# Patient Record
Sex: Male | Born: 1955
Health system: Southern US, Community
[De-identification: ages and names within clinical notes are randomized; demographics above are authoritative.]

## PROBLEM LIST (undated history)

## (undated) DIAGNOSIS — K219 Gastro-esophageal reflux disease without esophagitis: Secondary | ICD-10-CM

## (undated) DIAGNOSIS — T8859XA Other complications of anesthesia, initial encounter: Secondary | ICD-10-CM

## (undated) DIAGNOSIS — E7800 Pure hypercholesterolemia, unspecified: Secondary | ICD-10-CM

## (undated) DIAGNOSIS — Z9889 Other specified postprocedural states: Secondary | ICD-10-CM

## (undated) DIAGNOSIS — A770 Spotted fever due to Rickettsia rickettsii: Secondary | ICD-10-CM

## (undated) DIAGNOSIS — M1990 Unspecified osteoarthritis, unspecified site: Secondary | ICD-10-CM

## (undated) DIAGNOSIS — R112 Nausea with vomiting, unspecified: Secondary | ICD-10-CM

## (undated) DIAGNOSIS — T4145XA Adverse effect of unspecified anesthetic, initial encounter: Secondary | ICD-10-CM

## (undated) HISTORY — DX: Pure hypercholesterolemia, unspecified: E78.00

## (undated) HISTORY — DX: Gastro-esophageal reflux disease without esophagitis: K21.9

## (undated) HISTORY — DX: Spotted fever due to Rickettsia rickettsii: A77.0

## (undated) HISTORY — PX: VASECTOMY: SHX75

## (undated) HISTORY — DX: Unspecified osteoarthritis, unspecified site: M19.90

---

## 1990-10-01 HISTORY — PX: HERNIA REPAIR: SHX51

## 1993-10-01 HISTORY — PX: WRIST SURGERY: SHX841

## 1996-10-01 HISTORY — PX: VASECTOMY REVERSAL: SHX243

## 1998-06-03 ENCOUNTER — Ambulatory Visit (HOSPITAL_COMMUNITY): Admission: RE | Admit: 1998-06-03 | Discharge: 1998-06-03 | Payer: Self-pay | Admitting: *Deleted

## 2000-04-24 ENCOUNTER — Inpatient Hospital Stay (HOSPITAL_COMMUNITY): Admission: AD | Admit: 2000-04-24 | Discharge: 2000-04-26 | Payer: Self-pay | Admitting: Neurosurgery

## 2000-04-24 ENCOUNTER — Encounter: Payer: Self-pay | Admitting: Neurosurgery

## 2000-04-24 ENCOUNTER — Encounter: Admission: RE | Admit: 2000-04-24 | Discharge: 2000-04-24 | Payer: Self-pay | Admitting: Neurosurgery

## 2000-04-25 ENCOUNTER — Encounter: Payer: Self-pay | Admitting: Neurosurgery

## 2000-05-09 ENCOUNTER — Encounter: Payer: Self-pay | Admitting: Neurosurgery

## 2000-05-09 ENCOUNTER — Ambulatory Visit (HOSPITAL_COMMUNITY): Admission: RE | Admit: 2000-05-09 | Discharge: 2000-05-09 | Payer: Self-pay | Admitting: Neurosurgery

## 2000-05-23 ENCOUNTER — Encounter: Payer: Self-pay | Admitting: Neurosurgery

## 2000-05-23 ENCOUNTER — Ambulatory Visit (HOSPITAL_COMMUNITY): Admission: RE | Admit: 2000-05-23 | Discharge: 2000-05-23 | Payer: Self-pay | Admitting: Neurosurgery

## 2004-02-23 ENCOUNTER — Encounter: Admission: RE | Admit: 2004-02-23 | Discharge: 2004-02-23 | Payer: Self-pay | Admitting: Neurosurgery

## 2008-07-27 ENCOUNTER — Ambulatory Visit (HOSPITAL_BASED_OUTPATIENT_CLINIC_OR_DEPARTMENT_OTHER): Admission: RE | Admit: 2008-07-27 | Discharge: 2008-07-27 | Payer: Self-pay | Admitting: Orthopaedic Surgery

## 2008-07-27 HISTORY — PX: OTHER SURGICAL HISTORY: SHX169

## 2008-08-23 ENCOUNTER — Ambulatory Visit: Payer: Self-pay | Admitting: Internal Medicine

## 2008-08-24 ENCOUNTER — Ambulatory Visit: Payer: Self-pay | Admitting: Internal Medicine

## 2008-12-07 ENCOUNTER — Ambulatory Visit: Payer: Self-pay | Admitting: Internal Medicine

## 2008-12-07 ENCOUNTER — Encounter: Admission: RE | Admit: 2008-12-07 | Discharge: 2008-12-07 | Payer: Self-pay | Admitting: Internal Medicine

## 2009-08-23 ENCOUNTER — Encounter: Admission: RE | Admit: 2009-08-23 | Discharge: 2009-08-23 | Payer: Self-pay | Admitting: Neurosurgery

## 2010-03-16 ENCOUNTER — Encounter: Admission: RE | Admit: 2010-03-16 | Discharge: 2010-03-16 | Payer: Self-pay | Admitting: Neurosurgery

## 2010-12-21 ENCOUNTER — Ambulatory Visit (HOSPITAL_COMMUNITY)
Admission: RE | Admit: 2010-12-21 | Discharge: 2010-12-21 | Disposition: A | Discharge status: Home / Self Care | Payer: BC Managed Care – PPO | Source: Ambulatory Visit | Attending: Orthopedic Surgery | Admitting: Orthopedic Surgery

## 2010-12-21 ENCOUNTER — Other Ambulatory Visit (HOSPITAL_COMMUNITY): Payer: Self-pay | Admitting: Orthopedic Surgery

## 2010-12-21 ENCOUNTER — Encounter (HOSPITAL_COMMUNITY)
Admission: RE | Admit: 2010-12-21 | Discharge: 2010-12-21 | Disposition: A | Discharge status: Home / Self Care | Payer: BC Managed Care – PPO | Source: Ambulatory Visit | Attending: Orthopedic Surgery | Admitting: Orthopedic Surgery

## 2010-12-21 DIAGNOSIS — M19011 Primary osteoarthritis, right shoulder: Secondary | ICD-10-CM

## 2010-12-21 DIAGNOSIS — Z01812 Encounter for preprocedural laboratory examination: Secondary | ICD-10-CM | POA: Insufficient documentation

## 2010-12-21 DIAGNOSIS — Z01818 Encounter for other preprocedural examination: Secondary | ICD-10-CM | POA: Insufficient documentation

## 2010-12-21 LAB — ABO/RH: ABO/RH(D): A POS

## 2010-12-21 LAB — SURGICAL PCR SCREEN
MRSA, PCR: NEGATIVE
Staphylococcus aureus: NEGATIVE

## 2010-12-21 LAB — CBC
HCT: 43.8 % (ref 39.0–52.0)
Hemoglobin: 15.7 g/dL (ref 13.0–17.0)
MCH: 31.4 pg (ref 26.0–34.0)
MCHC: 35.8 g/dL (ref 30.0–36.0)
MCV: 87.6 fL (ref 78.0–100.0)
Platelets: 235 10*3/uL (ref 150–400)
RBC: 5 MIL/uL (ref 4.22–5.81)
RDW: 12.3 % (ref 11.5–15.5)
WBC: 6.9 10*3/uL (ref 4.0–10.5)

## 2010-12-21 LAB — PROTIME-INR
INR: 0.96 (ref 0.00–1.49)
Prothrombin Time: 13 seconds (ref 11.6–15.2)

## 2010-12-21 LAB — TYPE AND SCREEN
ABO/RH(D): A POS
Antibody Screen: NEGATIVE

## 2010-12-26 ENCOUNTER — Inpatient Hospital Stay (HOSPITAL_COMMUNITY)
Admission: RE | Admit: 2010-12-26 | Discharge: 2010-12-28 | DRG: 491 | Disposition: A | Discharge status: Home / Self Care | Payer: BC Managed Care – PPO | Source: Ambulatory Visit | Attending: Orthopedic Surgery | Admitting: Orthopedic Surgery

## 2010-12-26 ENCOUNTER — Inpatient Hospital Stay (HOSPITAL_COMMUNITY): Payer: BC Managed Care – PPO

## 2010-12-26 DIAGNOSIS — M19019 Primary osteoarthritis, unspecified shoulder: Principal | ICD-10-CM | POA: Diagnosis present

## 2010-12-26 DIAGNOSIS — Z7982 Long term (current) use of aspirin: Secondary | ICD-10-CM

## 2010-12-26 DIAGNOSIS — Z79899 Other long term (current) drug therapy: Secondary | ICD-10-CM

## 2010-12-26 HISTORY — PX: TOTAL SHOULDER ARTHROPLASTY: SHX126

## 2010-12-27 LAB — BASIC METABOLIC PANEL
BUN: 15 mg/dL (ref 6–23)
CO2: 25 mEq/L (ref 19–32)
Calcium: 8.4 mg/dL (ref 8.4–10.5)
Chloride: 108 mEq/L (ref 96–112)
Creatinine, Ser: 0.94 mg/dL (ref 0.4–1.5)
GFR calc Af Amer: >60 mL/min (ref >60)
GFR calc non Af Amer: >60 mL/min (ref >60)
Glucose, Bld: 128 mg/dL — ABNORMAL HIGH (ref 70–99)
Potassium: 4.2 mEq/L (ref 3.5–5.1)
Sodium: 139 mEq/L (ref 135–145)

## 2010-12-27 LAB — CBC
HCT: 36.8 % — ABNORMAL LOW (ref 39.0–52.0)
Hemoglobin: 12.8 g/dL — ABNORMAL LOW (ref 13.0–17.0)
MCH: 31.3 pg (ref 26.0–34.0)
MCHC: 34.8 g/dL (ref 30.0–36.0)
MCV: 90 fL (ref 78.0–100.0)
Platelets: 178 10*3/uL (ref 150–400)
RBC: 4.09 MIL/uL — ABNORMAL LOW (ref 4.22–5.81)
RDW: 12.6 % (ref 11.5–15.5)
WBC: 14.8 10*3/uL — ABNORMAL HIGH (ref 4.0–10.5)

## 2010-12-28 LAB — BASIC METABOLIC PANEL
BUN: 11 mg/dL (ref 6–23)
CO2: 28 mEq/L (ref 19–32)
Calcium: 8.5 mg/dL (ref 8.4–10.5)
Chloride: 103 mEq/L (ref 96–112)
Creatinine, Ser: 1.03 mg/dL (ref 0.4–1.5)
GFR calc Af Amer: >60 mL/min (ref >60)
GFR calc non Af Amer: >60 mL/min (ref >60)
Glucose, Bld: 113 mg/dL — ABNORMAL HIGH (ref 70–99)
Potassium: 4 mEq/L (ref 3.5–5.1)
Sodium: 138 mEq/L (ref 135–145)

## 2010-12-28 LAB — CBC
HCT: 32.9 % — ABNORMAL LOW (ref 39.0–52.0)
Hemoglobin: 11.4 g/dL — ABNORMAL LOW (ref 13.0–17.0)
MCH: 31.5 pg (ref 26.0–34.0)
MCHC: 34.7 g/dL (ref 30.0–36.0)
MCV: 90.9 fL (ref 78.0–100.0)
Platelets: 154 10*3/uL (ref 150–400)
RBC: 3.62 MIL/uL — ABNORMAL LOW (ref 4.22–5.81)
RDW: 12.8 % (ref 11.5–15.5)
WBC: 10.8 10*3/uL — ABNORMAL HIGH (ref 4.0–10.5)

## 2010-12-28 NOTE — Op Note (Signed)
[name], [name]            ACCOUNT NO.:  [id number]  MEDICAL RECORD NO.:  [id number]           PATIENT TYPE:  I  LOCATION:  5031                         FACILITY:  [facility]  PHYSICIAN:  Jones Broom, MD    DATE OF BIRTH:  September 12, 1956  DATE OF PROCEDURE:  12/26/2010 DATE OF DISCHARGE:                              OPERATIVE REPORT   PREOPERATIVE DIAGNOSIS:  Right shoulder osteoarthritis.  POSTOPERATIVE DIAGNOSIS:  Right shoulder osteoarthritis.  PROCEDURE PERFORMED:  Right shoulder total shoulder replacement.  ATTENDING SURGEON:  Jones Broom, MD  ASSISTANT:  Eulas Post, MD and McKenzie, PA-S  ANESTHESIA:  GETA with preoperative interscalene block.  COMPLICATIONS:  None.  DRAINS:  One medium Hemovac.  SPECIMENS:  Humeral head was cut and discarded.  ESTIMATED BLOOD LOSS:  250 mL.  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  Over the last 3 years, he has dealt with significant shoulder pain, which was initially evaluated by Dr. Jerl Murphy.  He had arthroscopy and was noted to have glenohumeral degenerative changes.  He had debridement, which was not significantly helpful.  He had intra-articular corticosteroid injection, which relieved his symptoms completely, but only for about 10 or 12 days and the pain subsequently returned.  He was on Vicodin as well as Mobic on a regular basis, but felt he could still not adequately control his symptoms and was not able to do the things that he enjoyed doing.  Given his limitation quality of life, he wanted to go forward with shoulder arthroplasty surgery.  I felt that given the extensive involvement of the glenoid, he would not be pleased with a hemiarthroplasty and therefore I recommend total shoulder arthroplasty despite his relatively young age and high activity level.  He understood risks, benefits, and alternatives to  surgery including, but not limited to risk of bleeding, infection, damage to neurovascular structures, risk of instability, stiffness, and potential need for future surgery.  He understood all of this and elected to go forward with surgery.  OPERATIVE FINDINGS:  Examination under anesthesia demonstrated external rotation to about 35 degrees on the right.  Forward flexion is to 170 degrees.  No instability was noted.  A DePuy press-fit AP size 12 stem with a 52 x 18 head at the 135 angle and a 48 cemented glenoid was placed.  Stem was not cemented.  PROCEDURE:  The patient was identified in the preoperative holding area where I personally marked the operative site after verifying site, side, and procedure with the patient.  He had an interscalene block given by the attending anesthesiologist and then was taken back to the operating room where general anesthesia was induced without complication.  He was then placed in the beach-chair position with the head and neck carefully padded in position and the opposite extremity as well as feet and legs carefully padded in position.  The right upper extremity was then prepped and draped in a standard sterile fashion.  The appropriate time- out procedure was carried out by myself, the operative staff, and anesthesia staff, all verifying  site, side, and procedure.  The patient did receive IV clindamycin before the incision was made.  The right upper extremity was prepped and draped in a standard sterile fashion and a sterile arm holder was used to assist in positioning during the procedure.  An approximately 12-cm incision was made from the tip of coracoid down to the center point of the humerus at the level of the axilla.  Dissection was carried down sharply through subcutaneous tissues and cephalic vein was identified and taken laterally with the deltoid.  The pectoralis major was taken medially.  The upper 1 cm of the pectoralis major was  released from its attachment on the humerus. Clavipectoral fascia was incised just lateral to conjoined tendon.  This incision was carried up to, but not into the coracoacromial ligament. Digital palpation was used to prove the integrity of the axillary nerve, which was protected throughout the procedure.  Musculocutaneous nerve was not palpated in the operative field.  The conjoined tendon was then gently retracted medially and the deltoid laterally.  The anterior circumflex humeral vessels were clamped and coagulated.  Soft tissue overlying the biceps was incised and this incision was carried across the transverse humeral ligament to the base of the coracoid.  The biceps was then tenodesed to the soft tissue just above the pectoralis major and the remaining portion of the biceps superiorly was excised.  An osteotomy was then performed to the lesser tuberosity and the subscapularis was freed from the underlying capsule.  Capsule was then released all the way down to the 6 o'clock position of the humeral head and neck.  The humeral head was then delivered with simultaneous adduction, extension, and external rotation.  All humeral osteophytes were removed.  There were small-to-moderate sized inferior humeral osteophytes and the anatomic neck of the humerus was marked and cut freehand at approximately 30 degrees retroversion within about 3 mm of the cuff reflection posteriorly.  The head size was estimated to be a 52 medium offset head.  At that point, the humeral head was retracted posteriorly with a Fakuda retractor and the anterior-inferior capsule was excised.  Great care was taken to protect the axillary nerve and was again palpated to ensure its integrity.  The remaining biceps anchor and the entire anterior-inferior labrum was excised.  The posterior labrum was excised, but the posterior capsule was not released.  The +3 48-mm guide was felt to be appropriate size for the glenoid to  allow some preferential anterior reaming given his slight retroversion noted on x- ray and posterior subluxation.  The reamer was used to ream to concentric bone with punctate bleeding.  This gave an excellent concentric surface.  The center hole was then drilled for an anchor peg glenoid followed by the 3 peripheral holes and none of the holes exited the glenoid wall.  This was then pulse irrigated and the holes were dried with Surgicel and thrombin.  The three peripheral holes were then pressurized, cemented, and the anchor peg glenoid was placed and impacted with an excellent fit.  This was held manually and placed in place until the cement hardened.  The proximal humerus was then again exposed taking care not to displace the glenoid.  The glenoid was a 48- mm component.  The humerus was then sequentially reamed going from 6-12 mm until the appropriate cortical contact was reached.  A 12 box osteotome was then used and a 12-mm broach.  The broach handle was removed and trial head was placed.  The eccentric 52 x 18 head fit best. With the trial implantation of the component, there was excellent soft tissue tension with approximately 50% posterior translation with immediate snap back to the anatomic position.  With forward elevation, there was no tendency towards posterior subluxation.  The trial was removed and the final implant was prepared on the back table with a 12 stem and a 52 x 18 eccentric head.  The implant was then impacted and achieved excellent anatomic reconstruction of the proximal humerus.  A #2 fiber wire was placed around the neck of the implant prior to impaction for later subscapularis repair.  The joint was then copiously irrigated with pulse lavage.  The subscapularis and lesser tuberosity osteotomy were repaired using three #2 fiber wires and the #2 FiberWire around the neck of the implant in a double row type repair.  One #1 Ethibond was placed in the rotator  interval just above the lesser tuberosity in a figure-of-eight fashion.  After repair of the lesser tuberosity, a medium Hemovac was placed at the anterolateral and again copious irrigation was used.  Again finger palpation was used to verify the integrity of the axillary nerve.  At this point, the wound was closed in layers with 2-0 Vicryl in deep dermal layer, 4-0 Monocryl for skin closure, and sterile dressings were then applied including Steri- Strips, 4x4s, ABDs, and tape.  The patient was placed in a sling, allowed to awaken from general anesthesia, transferred to the stretcher, and taken to the recovery room in a stable condition.  POSTOPERATIVE PLAN:  He will be kept overnight in the hospital for 2 nights for pain control and therapy.  Drain will be pulled tomorrow.  He will have Percocet and PCA for pain control.  He will begin early passive range of motion protocol with goal of 40 degrees external rotation, 140 degrees forward flexion.     Jones Broom, MD     JC/MEDQ  D:  12/26/2010  T:  12/27/2010  Job:  [id number]  Electronically Signed by Jones Broom  on 12/28/2010 01:19:53 PM

## 2011-02-13 NOTE — Op Note (Signed)
[name], [name]            ACCOUNT NO.:  [id number]   MEDICAL RECORD NO.:  [id number]          PATIENT TYPE:  AMB   LOCATION:  DSC                          FACILITY:  [facility]   PHYSICIAN:  Lubertha Basque. Dalldorf, M.D.DATE OF BIRTH:  12/27/1955   DATE OF PROCEDURE:  07/27/2008  DATE OF DISCHARGE:                               OPERATIVE REPORT   PREOPERATIVE DIAGNOSES:  1. Right shoulder impingement.  2. Right shoulder acromioclavicular degeneration.  3. Right shoulder partial rotator cuff tear.   POSTOPERATIVE DIAGNOSES:  1. Right shoulder impingement.  2. Right shoulder acromioclavicular degeneration.  3. Right shoulder partial rotator cuff tear.   PROCEDURES:  1. Right shoulder arthroscopic acromioplasty.  2. Right shoulder arthroscopic acromioclavicular resection.  3. Right shoulder arthroscopic subscapularis debridement.   ANESTHESIA:  General and block.   ATTENDING SURGEON:  Lubertha Basque. Jerl Santos, MD   ASSISTANT:  Lindwood Qua, PA   INDICATIONS FOR PROCEDURE:  The patient is a 55 year old man with many  years of right shoulder pain.  This has persisted despite oral anti-  inflammatories and therapy program.  He also received a subacromial  injection which did help significantly for several weeks.  By x-ray, he  has a prominent subacromial spur and spur at the [facility] joint.  His pain  which limits his ability to rest and use his arm and he is offered an  arthroscopy.  Informed operative consent was obtained after discussion  of possible complications including reaction to anesthesia and  infection.   SUMMARY FINDINGS AND PROCEDURE:  Under general anesthesia and a block, a  right shoulder arthroscopy was performed.  Glenohumeral joint showed  some mild degenerative change across the glenoid.  He had a partial  subscapularis tear addressed with the debridement.  The biceps and rest  of the rotator cuff appeared benign.  In the subacromial space, he had  some bursitis  addressed with a thorough bursectomy, but no significant  cuff tear was seen from above.  He did have a very prominent subacromial  morphology addressed with an acromioplasty back to a flat surface.  He  also had a spur and bone-on-bone contact at the [facility] joint and a formal AC  resection was done.   DESCRIPTION OF PROCEDURE:  The patient was brought to the operating  suite where general anesthetic was applied without difficulty.  He was  also given a block in the preanesthesia area.  He was positioned in  beach-chair position and prepped and draped in normal sterile fashion.  After administration of IV Kefzol, an arthroscopy of the right shoulder  was performed through a total of 3 portals.  Findings were as noted  above.  Procedure consisted of the subscapularis debridement followed by  work in the subacromial space.  I performed an acromioplasty with the  bur in the lateral position following transfer of the bur to the  posterior position.  I also performed the [facility] resection initially with a  bur in the lateral position following transfer of the bur to the  anterior position.  The cuff again was thoroughly examined.  The  shoulder was thoroughly  irrigated.  Simple sutures of nylon was used to  loosely reapproximate the portals followed by Adaptic and a dry gauze  dressing with tape.  Estimated blood loss and intraoperative fluids  obtained from anesthesia records.   DISPOSITION:  The patient was extubated in the operating room and taken  to recovery room in stable addition.  He was to go home on same day and  follow up with me in the office closely.  I will contact him by phone  tonight.      Lubertha Basque Jerl Santos, M.D.  Electronically Signed     PGD/MEDQ  D:  07/27/2008  T:  07/27/2008  Job:  [id number]

## 2011-02-16 NOTE — Discharge Summary (Signed)
[place]  Patient:    [name], [name]                     MRN: [id number] Adm. Date:  04/24/00 Disc. Date: 04/26/00 Attending:  Payton Doughty, M.D.                           Discharge Summary  ADMITTING DIAGNOSIS:  Intractable back pain probable herniated disk.  DISCHARGE DIAGNOSIS:  Herniated disk.  PROCEDURE:  Lumbar epidural steroid injection.  HISTORY OF PRESENT ILLNESS:  55 year-old right-handed white gentleman whose history and physical is recounted in the chart, basically developed intractable back pain over a 12 hour period the day prior to admission, was unable to walk.  Visited my office, and I decided to admit him and obtain an MRI.  His medical history is fairly unremarkable.  MEDICATIONS:  None.  ALLERGIES:  PENICILLIN.  PHYSICAL EXAMINATION:  GENERAL:  Unremarkable with extremely limited range of motion in his low back, inability to stand up straight.  NEUROLOGIC:  Neurologically he was intact with positive straight leg bilaterally.  He was admitted and placed on a PCA was still unable to get up to void.  He underwent an MRI which showed annular disruption of L4-L5 and L5, S1.  There was no large herniated disk.  There was some acute blood behind L4-L5.  Based on this study, he underwent a Lumbar epidural steroid which resulted in a marked improvement in his strength and his ability to move about.  He can sit up out of the bed and walk to the bathroom now.  He is using oral medications.  He is now being discharged home since he can walk.  FOLLOW-UP:  His follow-up will be in the [facility] office next week to arrange second lumbar epidural steroid injection.  His wife knows to call me with difficulties. DD:  04/26/00 TD:  04/29/00 Job: [id number]

## 2011-02-16 NOTE — H&P (Signed)
[place]  Patient:    [name], [name]                     MRN: [id number] Adm. Date:  04/24/00 Attending:  Payton Doughty, M.D.                         History and Physical  ADMITTING DIAGNOSIS:  Intractable back pain.  This is a 55 year old right-handed white gentleman, whose wife is a Garment/textile technologist at [employer].  Last night, he was playing racquetball and had the onset of pain in his right back, just above the posterior superior iliac spine. He used some Hydrocodone last night with some relief.  This morning, he was reaching overhead and had back pain that put him to his knees, and he came to me seeking further help.  It does not go down his legs.  He has not lost bowel/bladder control, but he cannot stand up straight and cannot walk.  MEDICAL HISTORY:  Remarkable for hypercholesterolemia.  He had a right wrist operation in 1995, a hernia in 1992, a vasectomy in 1991 with reversal in 1998, and a new vasectomy in 2000.  ALLERGIES:  He is allergic to PENICILLIN.  MEDICATIONS:  His only medication is Lipitor 10 mg q.d.  FAMILY HISTORY:  Mother is 60, with coronary artery disease.  Father deceased, f unknown illnesses.  REVIEW OF SYSTEMS:  Remarkable for high cholesterol and back pain.  PHYSICAL EXAMINATION:  HEENT:  Exam is within normal limits.  NECK:  He has reasonable range of motion in his neck.  CHEST:  Clear.  CARDIAC:  Regular rate and rhythm.  ABDOMEN:  Nontender with no hepatosplenomegaly.  EXTREMITIES:  Without clubbing or cyanosis.  GU:  Exam is deferred.  Peripheral pulses are good.  NEUROLOGIC:  He is awake, alert and oriented; cranial nerves are intact.  Motor  exam demonstrates 5/5 strength throughout the upper and lower extremities, save for pain limit of hip flexion or weakness on the right side.  There is no current sensory deficit.  Reflexes are 1 at the knees, 1 at the ankles; toes  downgoing bilaterally.  Straight leg raise is negative.  When strength testing the upper extremities, pulling was very painful and pushing was not.  Plain spine films are unremarkable.  CLINICAL IMPRESSION:  Intractable back pain; may represent simple strain but I m quite concerned about him having a mid or upper level disc rupture.  PLAN:  I am planning on admitting him for pain control and obtaining an MRI. DD:  04/24/00 TD:  04/24/00 Job: [id number]

## 2011-09-07 ENCOUNTER — Encounter: Payer: Self-pay | Admitting: Internal Medicine

## 2011-10-05 ENCOUNTER — Encounter: Payer: Self-pay | Admitting: Internal Medicine

## 2011-10-05 ENCOUNTER — Ambulatory Visit (INDEPENDENT_AMBULATORY_CARE_PROVIDER_SITE_OTHER): Payer: BC Managed Care – PPO | Admitting: Internal Medicine

## 2011-10-05 VITALS — BP 118/88 | HR 72 | Ht 68.0 in | Wt 232.8 lb

## 2011-10-05 DIAGNOSIS — E785 Hyperlipidemia, unspecified: Secondary | ICD-10-CM | POA: Insufficient documentation

## 2011-10-05 DIAGNOSIS — R195 Other fecal abnormalities: Secondary | ICD-10-CM

## 2011-10-05 MED ORDER — PEG-KCL-NACL-NASULF-NA ASC-C 100 G PO SOLR: 1.0000 | Freq: Once | ORAL | Status: DC

## 2011-10-05 NOTE — Patient Instructions (Signed)
You have been scheduled for a Colonoscopy with separate instructions given. Your prep kit has been sent to your pharmacy for you to pick up. 

## 2011-10-05 NOTE — Progress Notes (Signed)
  Subjective:    Patient ID: [name], male    DOB: 10/24/1955, 56 y.o.   MRN: [id number]  HPI  This 56 year old white man had 2 of 3 stool cards positive for Hemoccult testing recently. He has no other active GI symptoms. I reviewed the office notes from his primary care physician Dr. Roseanne Reno. Allergies  Allergen Reactions  . Penicillins    Outpatient Prescriptions Prior to Visit  Medication Sig Dispense Refill  . atorvastatin (LIPITOR) 10 MG tablet Take 10 mg by mouth daily.        . clotrimazole-betamethasone (LOTRISONE) cream Apply 1 application topically 2 (two) times daily.        . famotidine (PEPCID) 20 MG tablet Take 20 mg by mouth daily.        Marland Kitchen HYDROcodone-acetaminophen (LORTAB) 10-500 MG per tablet 1/2-1 tablet by mouth three times a day as needed        Past Medical History  Diagnosis Date  . [facility] spotted fever)   . Hypercholesterolemia    Past Surgical History  Procedure Date  . Wrist surgery 1995    right  . Hernia repair 1992  . Vasectomy 1991, 2000  . Vasectomy reversal 1998  . Right shoulder arthroscopic acrominoplasty 07/27/2008    w/acromioclavicular resection, subcapularis debridement  . Total shoulder arthroplasty 12/26/2010    right   History   Social History  . Marital Status: Married         Number of Children: 3  .     Occupational History  . retired    Social History Main Topics  . Smoking status: Never Smoker   . Smokeless tobacco: Former Neurosurgeon   Comment: quit 15 years ago  . Alcohol Use: No  . Drug Use: No  .            Social History Narrative   Retired Chartered loss adjuster.   Family History  Problem Relation Age of Onset  . Coronary artery disease Mother   . Lung cancer Mother   . Crohn's disease Brother         Review of Systems Positive for some back pain and itching, all other systems negative    Objective:   Physical Exam General:  NAD Eyes:   anicteric Lungs:  clear Heart:  S1S2 no rubs,  murmurs or gallops Abdomen:  soft and nontender, BS+ Ext:   no edema             Assessment & Plan:   1. Heme + stool    He has an asymptomatic Hemoccult-positive stool. Colonoscopy is indicated given the positive screening Hemoccults. I have explained the risks benefits and indications and he understands and agrees to proceed. I appreciate the opportunity to care for this patient.

## 2011-10-08 ENCOUNTER — Encounter: Payer: Self-pay | Admitting: Internal Medicine

## 2011-11-15 ENCOUNTER — Ambulatory Visit (AMBULATORY_SURGERY_CENTER): Payer: BC Managed Care – PPO | Admitting: Internal Medicine

## 2011-11-15 ENCOUNTER — Encounter: Payer: Self-pay | Admitting: Internal Medicine

## 2011-11-15 VITALS — BP 142/77 | HR 57 | Temp 95.5°F | Resp 22 | Ht 68.0 in | Wt 232.0 lb

## 2011-11-15 DIAGNOSIS — K5730 Diverticulosis of large intestine without perforation or abscess without bleeding: Secondary | ICD-10-CM

## 2011-11-15 DIAGNOSIS — R195 Other fecal abnormalities: Secondary | ICD-10-CM

## 2011-11-15 DIAGNOSIS — K648 Other hemorrhoids: Secondary | ICD-10-CM

## 2011-11-15 HISTORY — PX: COLONOSCOPY: SHX174

## 2011-11-15 MED ORDER — SODIUM CHLORIDE 0.9 % IV SOLN: 500.0000 mL | INTRAVENOUS | Status: DC

## 2011-11-15 NOTE — Op Note (Signed)
[facility] [address] [place], [place]  [address]  COLONOSCOPY PROCEDURE REPORT  PATIENT:  [name], [name]  MR#:  [id number] BIRTHDATE:  Feb 20, 1956, 55 yrs. old  GENDER:  male ENDOSCOPIST:  Iva Boop, MD, [facility] REF. BY:  Quitman Livings, M.D. PROCEDURE DATE:  11/15/2011 PROCEDURE:  Colonoscopy 19147 ASA CLASS:  Class II INDICATIONS:  heme positive stool MEDICATIONS:   These medications were titrated to patient response per physician's verbal order 100 mcg IV, Versed 10 mg IV, These medications were titrated to patient response per physician's verbal order  DESCRIPTION OF PROCEDURE:   After the risks benefits and alternatives of the procedure were thoroughly explained, informed consent was obtained.  Digital rectal exam was performed and revealed no abnormalities and normal prostate.   The LB 180AL [id number] endoscope was introduced through the anus and advanced to the cecum, which was identified by both the appendix and ileocecal valve, without limitations.  The quality of the prep was excellent, using MoviPrep.  The instrument was then slowly withdrawn as the colon was fully examined. <<PROCEDUREIMAGES>>  FINDINGS:  Mild diverticulosis was found in the sigmoid colon. This was otherwise a normal examination of the colon.   Retroflexed views in the rectum revealed internal hemorrhoids.    The time to cecum = 1:57 minutes. The scope was then withdrawn in 12:11 minutes from the cecum and the procedure completed. COMPLICATIONS:  None ENDOSCOPIC IMPRESSION: 1) Mild diverticulosis in the sigmoid colon 2) Internal hemorrhoids - likely cause heme + stool 3) Otherwise normal examination - excellent prep  REPEAT EXAM:  In 10 year(s) for routine screening colonoscopy.  Iva Boop, MD, [facility]  CC:  The Patient and Quitman Livings, MD  n. Rosalie Doctor:   Iva Boop at 11/15/2011 11:03 AM  Darrall Dears, [phone]

## 2011-11-15 NOTE — Patient Instructions (Addendum)
Please se the colonoscopy report. I think internal hemorrhoids caused the microscopic blood in the stool. Iva Boop, MD, [facility]  See the picture page for your findings from your exam today.  Follow the green and blue discharge instruction sheets the rest of the day.  Resume your prior medications today. Please call if any questions or concerns.

## 2011-11-15 NOTE — Progress Notes (Signed)
The pt had no complaints noted in the recovery room. Maw  Patient did not experience any of the following events: a burn prior to discharge; a fall within the facility; wrong site/side/patient/procedure/implant event; or a hospital transfer or hospital admission upon discharge from the facility. [phone]) Patient did not experience any of the following events: a burn prior to discharge; a fall within the facility; wrong site/side/patient/procedure/implant event; or a hospital transfer or hospital admission upon discharge from the facility. [phone]) Patient did not have preoperative order for IV antibiotic SSI prophylaxis. [phone])

## 2011-11-16 ENCOUNTER — Telehealth: Payer: Self-pay | Admitting: *Deleted

## 2011-11-16 NOTE — Telephone Encounter (Signed)
No answer, identifier on cell phone. Message left to call if any questions or concerns. 

## 2014-10-01 HISTORY — PX: BACK SURGERY: SHX140

## 2015-02-11 ENCOUNTER — Encounter (HOSPITAL_COMMUNITY): Payer: Self-pay | Admitting: *Deleted

## 2015-02-11 ENCOUNTER — Emergency Department (HOSPITAL_COMMUNITY)
Admission: EM | Admit: 2015-02-11 | Discharge: 2015-02-11 | Disposition: A | Discharge status: Home / Self Care | Payer: BC Managed Care – PPO | Attending: Emergency Medicine | Admitting: Emergency Medicine

## 2015-02-11 ENCOUNTER — Emergency Department (HOSPITAL_COMMUNITY): Payer: BC Managed Care – PPO

## 2015-02-11 DIAGNOSIS — Z7982 Long term (current) use of aspirin: Secondary | ICD-10-CM | POA: Insufficient documentation

## 2015-02-11 DIAGNOSIS — K219 Gastro-esophageal reflux disease without esophagitis: Secondary | ICD-10-CM | POA: Diagnosis not present

## 2015-02-11 DIAGNOSIS — E780 Pure hypercholesterolemia: Secondary | ICD-10-CM | POA: Insufficient documentation

## 2015-02-11 DIAGNOSIS — Z79899 Other long term (current) drug therapy: Secondary | ICD-10-CM | POA: Insufficient documentation

## 2015-02-11 DIAGNOSIS — M48061 Spinal stenosis, lumbar region without neurogenic claudication: Secondary | ICD-10-CM

## 2015-02-11 DIAGNOSIS — Z8619 Personal history of other infectious and parasitic diseases: Secondary | ICD-10-CM | POA: Diagnosis not present

## 2015-02-11 DIAGNOSIS — Z880 Allergy status to penicillin: Secondary | ICD-10-CM | POA: Insufficient documentation

## 2015-02-11 DIAGNOSIS — M1990 Unspecified osteoarthritis, unspecified site: Secondary | ICD-10-CM | POA: Diagnosis not present

## 2015-02-11 DIAGNOSIS — M4806 Spinal stenosis, lumbar region: Secondary | ICD-10-CM | POA: Insufficient documentation

## 2015-02-11 DIAGNOSIS — M545 Low back pain: Secondary | ICD-10-CM | POA: Diagnosis present

## 2015-02-11 DIAGNOSIS — M549 Dorsalgia, unspecified: Secondary | ICD-10-CM

## 2015-02-11 LAB — CBC
HCT: 48.5 % (ref 39.0–52.0)
Hemoglobin: 17.3 g/dL — ABNORMAL HIGH (ref 13.0–17.0)
MCH: 32.3 pg (ref 26.0–34.0)
MCHC: 35.7 g/dL (ref 30.0–36.0)
MCV: 90.7 fL (ref 78.0–100.0)
Platelets: 205 10*3/uL (ref 150–400)
RBC: 5.35 MIL/uL (ref 4.22–5.81)
RDW: 12.7 % (ref 11.5–15.5)
WBC: 9.3 10*3/uL (ref 4.0–10.5)

## 2015-02-11 LAB — BASIC METABOLIC PANEL
Anion gap: 9 (ref 5–15)
BUN: 20 mg/dL (ref 6–20)
CO2: 25 mmol/L (ref 22–32)
Calcium: 9.4 mg/dL (ref 8.9–10.3)
Chloride: 103 mmol/L (ref 101–111)
Creatinine, Ser: 1.04 mg/dL (ref 0.61–1.24)
GFR calc Af Amer: >60 mL/min (ref >60)
GFR calc non Af Amer: >60 mL/min (ref >60)
Glucose, Bld: 171 mg/dL — ABNORMAL HIGH (ref 65–99)
Potassium: 4.5 mmol/L (ref 3.5–5.1)
Sodium: 137 mmol/L (ref 135–145)

## 2015-02-11 MED ORDER — HYDROMORPHONE HCL 1 MG/ML IJ SOLN
1.0000 mg | Freq: Once | INTRAMUSCULAR | Status: AC
  Administered 2015-02-11: 1 mg via INTRAVENOUS
  Filled 2015-02-11: qty 1

## 2015-02-11 MED ORDER — ONDANSETRON HCL 4 MG/2ML IJ SOLN
4.0000 mg | Freq: Once | INTRAMUSCULAR | Status: AC
  Administered 2015-02-11: 4 mg via INTRAVENOUS
  Filled 2015-02-11: qty 2

## 2015-02-11 MED ORDER — METHOCARBAMOL 500 MG PO TABS
500.0000 mg | ORAL_TABLET | Freq: Once | ORAL | Status: AC
  Administered 2015-02-11: 500 mg via ORAL
  Filled 2015-02-11: qty 1

## 2015-02-11 MED ORDER — OXYCODONE-ACETAMINOPHEN 5-325 MG PO TABS: 1.0000 | ORAL_TABLET | ORAL | Status: DC | PRN

## 2015-02-11 MED ORDER — METHOCARBAMOL 500 MG PO TABS: 500.0000 mg | ORAL_TABLET | Freq: Two times a day (BID) | ORAL | Status: DC | PRN

## 2015-02-11 MED ORDER — DEXAMETHASONE SODIUM PHOSPHATE 10 MG/ML IJ SOLN
10.0000 mg | Freq: Once | INTRAMUSCULAR | Status: AC
  Administered 2015-02-11: 10 mg via INTRAVENOUS
  Filled 2015-02-11: qty 1

## 2015-02-11 MED ORDER — PREDNISONE 20 MG PO TABS: 40.0000 mg | ORAL_TABLET | Freq: Every day | ORAL | Status: DC

## 2015-02-11 MED ORDER — IBUPROFEN 800 MG PO TABS: 800.0000 mg | ORAL_TABLET | Freq: Three times a day (TID) | ORAL | Status: DC

## 2015-02-11 NOTE — ED Notes (Signed)
MD at bedside. 

## 2015-02-11 NOTE — ED Provider Notes (Signed)
CSN: [id number]     Arrival date & time 02/11/15  1056 History   First MD Initiated Contact with Patient 02/11/15 1102     Chief Complaint  Patient presents with  . 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 Danielle DessElsner - he has had approximately 6 weeks of pain in the lower back - bilateral buttock but acutely worse over the last 5 days - he has severe pain, radiates down the L leg to the foot - associated with numbness and tingling at the foot - much worse with walking - very difficult to ambulate at all.  No urinary sx, no fevers, no IVDU, no CA.  Pain meds at home without improvement - has not seen NS recently,  Patient is a 59 y.o. male presenting with back pain. The history is provided by the patient and medical records.  Back Pain   Past Medical History  Diagnosis Date  . 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 Hypercholesterolemia   . Arthritis     BACK  . GERD (gastroesophageal reflux disease)    Past Surgical History  Procedure Laterality Date  . Wrist surgery  1995    right  . Hernia repair  1992  . Vasectomy  1991, 2000  . Vasectomy reversal  1998  . Right shoulder arthroscopic acrominoplasty  07/27/2008    w/acromioclavicular resection, subcapularis debridement  . Total shoulder arthroplasty  12/26/2010    right  . Colonoscopy  11/15/11   Family History  Problem Relation Age of Onset  . Coronary artery disease Mother   . Lung cancer Mother   . Crohn's disease Brother    History  Substance Use Topics  . Smoking status: Never Smoker   . Smokeless tobacco: Former NeurosurgeonUser     Comment: quit 15 years ago  . Alcohol Use: No    Review of Systems  Musculoskeletal: Positive for back pain.  All other systems reviewed and are negative.     Allergies  Penicillins  Home Medications   Prior to  Admission medications   Medication Sig Start Date End Date Taking? Authorizing Provider  aspirin 81 MG tablet Take 81 mg by mouth daily.   Yes Historical Provider, MD  atorvastatin (LIPITOR) 10 MG tablet Take 10 mg by mouth daily.     Yes Historical Provider, MD  cyclobenzaprine (FLEXERIL) 10 MG tablet Take 10 mg by mouth 3 (three) times daily as needed for muscle spasms.   Yes Historical Provider, MD  famotidine (PEPCID) 20 MG tablet Take 20 mg by mouth daily.     Yes Historical Provider, MD  HYDROcodone-acetaminophen (LORTAB) 10-500 MG per tablet Take 1 tablet by mouth every 6 (six) hours as needed for pain. 1/2-1 tablet by mouth three times a day as needed   Yes Historical Provider, MD  omeprazole (PRILOSEC) 20 MG capsule Take 20 mg by mouth every evening.   Yes Historical Provider, MD  ibuprofen (ADVIL,MOTRIN) 800 MG tablet Take 1 tablet (800 mg total) by mouth 3 (three) times daily. 02/11/15   Eber HongBrian Anjela Cassara, MD  methocarbamol (ROBAXIN) 500 MG tablet Take 1 tablet (500 mg total) by mouth 2 (two) times daily as needed for muscle spasms. 02/11/15   Eber HongBrian Exander Shaul, MD  oxyCODONE-acetaminophen (PERCOCET) 5-325 MG per tablet Take 1 tablet by mouth every 4 (four) hours as needed. 02/11/15  Eber Hong, MD  predniSONE (DELTASONE) 20 MG tablet Take 2 tablets (40 mg total) by mouth daily. Take 40 mg by mouth daily for 3 days, then  by mouth daily for 3 days, then  daily for 3 days 02/11/15   Eber Hong, MD   BP 125/74 mmHg  Pulse 66  Temp(Src) 98.2 F (36.8 C) (Oral)  Resp 18  Ht  (1.727 m)  Wt 235 lb (106.595 kg)  BMI 35.74 kg/m2  SpO2 93% Physical Exam  Constitutional: He appears well-developed and well-nourished. No distress.  HENT:  Head: Normocephalic and atraumatic.  Mouth/Throat: Oropharynx is clear and moist. No oropharyngeal exudate.  Eyes: Conjunctivae and EOM are normal. Pupils are equal, round, and reactive to light. Right eye exhibits no discharge. Left eye exhibits no  discharge. No scleral icterus.  Neck: Normal range of motion. Neck supple. No JVD present. No thyromegaly present.  Cardiovascular: Normal rate, regular rhythm, normal heart sounds and intact distal pulses.  Exam reveals no gallop and no friction rub.   No murmur heard. Pulmonary/Chest: Effort normal and breath sounds normal. No respiratory distress. He has no wheezes. He has no rales.  Abdominal: Soft. Bowel sounds are normal. He exhibits no distension and no mass. There is no tenderness.  Musculoskeletal: Normal range of motion. He exhibits tenderness ( obvious discomfort - ttp in the lumbar and paraspinal muscles into the L buttock.). He exhibits no edema.  Lymphadenopathy:    He has no cervical adenopathy.  Neurological: He is alert. Coordination normal.  Normal appearing legs, Normal sensation L4 but has sensory defecits L L5 and S1, weakness with extensor hallucis, pain with SLR on the L, not on the R.    Skin: Skin is warm and dry. No rash noted. No erythema.  Psychiatric: He has a normal mood and affect. His behavior is normal.  Nursing note and vitals reviewed.   ED Course  Procedures (including critical care time) Labs Review Labs Reviewed  CBC - Abnormal; Notable for the following:    Hemoglobin 17.3 (*)    All other components within normal limits  BASIC METABOLIC PANEL - Abnormal; Notable for the following:    Glucose, Bld 171 (*)    All other components within normal limits    Imaging Review Mr Lumbar Spine Wo Contrast  02/11/2015   CLINICAL DATA:  Bilateral lower back and buttock pain increasing for several days. Pain radiates down the left leg. Difficulty ambulating.  EXAM: MRI LUMBAR SPINE WITHOUT CONTRAST  TECHNIQUE: Multiplanar, multisequence MR imaging of the lumbar spine was performed. No intravenous contrast was administered.  COMPARISON:  10/11/2011  FINDINGS: Vertebral alignment is unchanged with grade 1 retrolisthesis of L5 on S1. Vertebral body heights are  preserved. Disc desiccation and mild disc space narrowing are present at L4-5 and L5-S1 with disc space narrowing having increased from the prior study. Mild degenerative endplate marrow changes are present at L5-S1 greater than L4-5, similar to the prior study. There is a new, small L4 inferior endplate Schmorl's node. Conus medullaris is normal in signal and terminates at L1-2. Paraspinal soft tissues are unremarkable.  L1-2 and L2-3:  Negative.  L3-4:  Minimal disc bulging without stenosis.  L4-5: Increased circumferential disc bulging and moderate facet and ligamentum flavum hypertrophy result in new, moderate spinal stenosis, left greater than right lateral recess stenosis, and moderate bilateral neural foraminal stenosis.  L5-S1: Circumferential disc bulging and moderate facet hypertrophy result in mild bilateral lateral recess stenosis and moderate  to severe right and severe left neural foraminal stenosis, not significantly changed. No spinal stenosis.  IMPRESSION: 1. Progressive disc degeneration at L4-5 contributing to new, moderate spinal stenosis and bilateral neural foraminal stenosis. 2. Unchanged left greater than right neural foraminal stenosis at L5-S1.   Electronically Signed   By: Sebastian AcheAllen  Grady   On: 02/11/2015 14:29      MDM   Final diagnoses:  Back pain  Spinal stenosis at L4-L5 level    Etiology of pain is likely spinal - there is neuro deficits with sensation and some weakness in L5, has known disease, radiology studies from 2011 reviewed, needs MRI, pain control, d/w NS as needed.  Case discussed with Dr. Jeral FruitBotero - we have both reviewed the MRI - the pt is feeling better - f/u has been arranged for Monday at 1:30 - pt informed - pain meds, steroids and back brace.    Pt expresses understanding of the d/c plan.  Stable for d/c.  Meds given in ED:  Medications  HYDROmorphone (DILAUDID) injection 1 mg (1 mg Intravenous Given 02/11/15 1143)  methocarbamol (ROBAXIN) tablet 500  mg (500 mg Oral Given 02/11/15 1140)  dexamethasone (DECADRON) injection 10 mg (10 mg Intravenous Given 02/11/15 1140)  ondansetron (ZOFRAN) injection 4 mg (4 mg Intravenous Given 02/11/15 1150)    New Prescriptions   IBUPROFEN (ADVIL,MOTRIN) 800 MG TABLET    Take 1 tablet (800 mg total) by mouth 3 (three) times daily.   METHOCARBAMOL (ROBAXIN) 500 MG TABLET    Take 1 tablet (500 mg total) by mouth 2 (two) times daily as needed for muscle spasms.   OXYCODONE-ACETAMINOPHEN (PERCOCET) 5-325 MG PER TABLET    Take 1 tablet by mouth every 4 (four) hours as needed.   PREDNISONE (DELTASONE) 20 MG TABLET    Take 2 tablets (40 mg total) by mouth daily. Take 40 mg by mouth daily for 3 days, then 20mg  by mouth daily for 3 days, then 10mg  daily for 3 days      Eber HongBrian Prem Coykendall, MD 02/11/15 1551

## 2015-02-11 NOTE — ED Notes (Signed)
[place]; [phone]

## 2015-02-11 NOTE — ED Notes (Signed)
Patient transported to MRI 

## 2015-02-11 NOTE — ED Notes (Signed)
Pt returned from MRI °

## 2015-02-11 NOTE — ED Notes (Signed)
Pt transported to MRI 

## 2015-02-11 NOTE — Discharge Instructions (Signed)

## 2015-02-11 NOTE — ED Notes (Signed)
Pt reports hx of back pain. Increase in pain for several days. Bilateral lower back and buttock, radiates down left leg. Having difficulty ambulating. Denies incontinence.

## 2015-03-15 ENCOUNTER — Other Ambulatory Visit: Payer: Self-pay | Admitting: Neurological Surgery

## 2015-03-18 ENCOUNTER — Encounter (HOSPITAL_COMMUNITY): Payer: Self-pay

## 2015-03-18 ENCOUNTER — Encounter (HOSPITAL_COMMUNITY)
Admission: RE | Admit: 2015-03-18 | Discharge: 2015-03-18 | Disposition: A | Discharge status: Home / Self Care | Payer: BC Managed Care – PPO | Source: Ambulatory Visit | Attending: Neurological Surgery | Admitting: Neurological Surgery

## 2015-03-18 HISTORY — DX: Nausea with vomiting, unspecified: Z98.890

## 2015-03-18 HISTORY — DX: Other specified postprocedural states: R11.2

## 2015-03-18 HISTORY — DX: Other complications of anesthesia, initial encounter: T88.59XA

## 2015-03-18 HISTORY — DX: Adverse effect of unspecified anesthetic, initial encounter: T41.45XA

## 2015-03-18 LAB — BASIC METABOLIC PANEL
Anion gap: 8 (ref 5–15)
BUN: 11 mg/dL (ref 6–20)
CALCIUM: 9.3 mg/dL (ref 8.9–10.3)
CHLORIDE: 105 mmol/L (ref 101–111)
CO2: 25 mmol/L (ref 22–32)
CREATININE: 1 mg/dL (ref 0.61–1.24)
GFR calc Af Amer: >60 mL/min (ref >60)
GFR calc non Af Amer: >60 mL/min (ref >60)
Glucose, Bld: 175 mg/dL — ABNORMAL HIGH (ref 65–99)
Potassium: 4 mmol/L (ref 3.5–5.1)
Sodium: 138 mmol/L (ref 135–145)

## 2015-03-18 LAB — CBC
HCT: 41.2 % (ref 39.0–52.0)
Hemoglobin: 14.2 g/dL (ref 13.0–17.0)
MCH: 30.9 pg (ref 26.0–34.0)
MCHC: 34.5 g/dL (ref 30.0–36.0)
MCV: 89.8 fL (ref 78.0–100.0)
PLATELETS: 179 10*3/uL (ref 150–400)
RBC: 4.59 MIL/uL (ref 4.22–5.81)
RDW: 12.4 % (ref 11.5–15.5)
WBC: 7.5 10*3/uL (ref 4.0–10.5)

## 2015-03-18 LAB — TYPE AND SCREEN
ABO/RH(D): A POS
Antibody Screen: NEGATIVE

## 2015-03-18 LAB — SURGICAL PCR SCREEN
MRSA, PCR: NEGATIVE
STAPHYLOCOCCUS AUREUS: NEGATIVE

## 2015-03-18 NOTE — Progress Notes (Signed)
Office called to hve dr sign orders. Spoke with Shanda Bumps

## 2015-03-18 NOTE — Pre-Procedure Instructions (Addendum)
[name]  03/18/2015      [id number] [place], [place] - [address] [place] [address] Phone: [phone] Fax: [phone]  [facility] - [place], [place] - [address] [facility] [address] [place] [address] Phone: [phone] Fax: [phone]    Your procedure is scheduled on *03/21/15  Report to [facility] short stay admitting at 12:15 P.M.  Call this number if you have problems the morning of surgery:  [phone]   Remember:  Do not eat food or drink liquids after midnight.  Take these medicines the morning of surgery with A SIP OF WATER flexeril,pain med if needed      ,pepcid     STOP all herbel meds, nsaids (aleve,naproxen,advil,ibuprofen) starting now including vitamins,aspirin   Do not wear jewelry, make-up or nail polish.  Do not wear lotions, powders, or perfumes.  You may wear deodorant.  Do not shave 48 hours prior to surgery.  Men may shave face and neck.  Do not bring valuables to the hospital.  [facility] is not responsible for any belongings or valuables.  Contacts, dentures or bridgework may not be worn into surgery.  Leave your suitcase in the car.  After surgery it may be brought to your room.  For patients admitted to the hospital, discharge time will be determined by your treatment team.  Patients discharged the day of surgery will not be allowed to drive home.   Name and phone number of your driver:    Special instructions:  Special Instructions:  - Preparing for Surgery  Before surgery, you can play an important role.  Because skin is not sterile, your skin needs to be as free of germs as possible.  You can reduce the number of germs on you skin by washing with CHG (chlorahexidine gluconate) soap before surgery.  CHG is an antiseptic cleaner which kills germs and bonds with the skin to continue killing germs even after washing.  Please DO NOT use if you have  an allergy to CHG or antibacterial soaps.  If your skin becomes reddened/irritated stop using the CHG and inform your nurse when you arrive at Short Stay.  Do not shave (including legs and underarms) for at least 48 hours prior to the first CHG shower.  You may shave your face.  Please follow these instructions carefully:   1.  Shower with CHG Soap the night before surgery and the morning of Surgery.  2.  If you choose to wash your hair, wash your hair first as usual with your normal shampoo.  3.  After you shampoo, rinse your hair and body thoroughly to remove the Shampoo.  4.  Use CHG as you would any other liquid soap.  You can apply chg directly  to the skin and wash gently with scrungie or a clean washcloth.  5.  Apply the CHG Soap to your body ONLY FROM THE NECK DOWN.  Do not use on open wounds or open sores.  Avoid contact with your eyes ears, mouth and genitals (private parts).  Wash genitals (private parts)       with your normal soap.  6.  Wash thoroughly, paying special attention to the area where your surgery will be performed.  7.  Thoroughly rinse your body with warm water from the neck down.  8.  DO NOT shower/wash with your normal soap after using and rinsing off the CHG Soap.  9.  Pat yourself dry with a clean towel.            10.  Wear clean pajamas.            11.  Place clean sheets on your bed the night of your first shower and do not sleep with pets.  Day of Surgery  Do not apply any lotions/deodorants the morning of surgery.  Please wear clean clothes to the hospital/surgery center.  Please read over the following fact sheets that you were given. Pain Booklet, Coughing and Deep Breathing, Blood Transfusion Information, MRSA Information and Surgical Site Infection Prevention

## 2015-03-21 ENCOUNTER — Encounter (HOSPITAL_COMMUNITY)
Admission: RE | Disposition: A | Discharge status: Home / Self Care | Payer: BC Managed Care – PPO | Source: Ambulatory Visit | Attending: Neurological Surgery

## 2015-03-21 ENCOUNTER — Inpatient Hospital Stay (HOSPITAL_COMMUNITY): Payer: BC Managed Care – PPO | Admitting: Anesthesiology

## 2015-03-21 ENCOUNTER — Inpatient Hospital Stay (HOSPITAL_COMMUNITY)
Admission: RE | Admit: 2015-03-21 | Discharge: 2015-03-23 | DRG: 460 | Disposition: A | Discharge status: Home / Self Care | Payer: BC Managed Care – PPO | Source: Ambulatory Visit | Attending: Neurological Surgery | Admitting: Neurological Surgery

## 2015-03-21 ENCOUNTER — Inpatient Hospital Stay (HOSPITAL_COMMUNITY): Payer: BC Managed Care – PPO

## 2015-03-21 ENCOUNTER — Encounter (HOSPITAL_COMMUNITY): Payer: Self-pay | Admitting: *Deleted

## 2015-03-21 DIAGNOSIS — Z96611 Presence of right artificial shoulder joint: Secondary | ICD-10-CM | POA: Diagnosis present

## 2015-03-21 DIAGNOSIS — M5116 Intervertebral disc disorders with radiculopathy, lumbar region: Secondary | ICD-10-CM | POA: Diagnosis present

## 2015-03-21 DIAGNOSIS — E780 Pure hypercholesterolemia: Secondary | ICD-10-CM | POA: Diagnosis present

## 2015-03-21 DIAGNOSIS — M549 Dorsalgia, unspecified: Secondary | ICD-10-CM | POA: Diagnosis present

## 2015-03-21 DIAGNOSIS — M21372 Foot drop, left foot: Secondary | ICD-10-CM | POA: Diagnosis present

## 2015-03-21 DIAGNOSIS — M47896 Other spondylosis, lumbar region: Secondary | ICD-10-CM | POA: Diagnosis present

## 2015-03-21 DIAGNOSIS — K219 Gastro-esophageal reflux disease without esophagitis: Secondary | ICD-10-CM | POA: Diagnosis present

## 2015-03-21 DIAGNOSIS — Z419 Encounter for procedure for purposes other than remedying health state, unspecified: Secondary | ICD-10-CM

## 2015-03-21 DIAGNOSIS — Z01812 Encounter for preprocedural laboratory examination: Secondary | ICD-10-CM

## 2015-03-21 DIAGNOSIS — M5126 Other intervertebral disc displacement, lumbar region: Secondary | ICD-10-CM | POA: Diagnosis present

## 2015-03-21 SURGERY — POSTERIOR LUMBAR FUSION 1 LEVEL
Anesthesia: General | Site: Back

## 2015-03-21 MED ORDER — SODIUM CHLORIDE 0.9 % IJ SOLN
3.0000 mL | Freq: Two times a day (BID) | INTRAMUSCULAR | Status: DC
  Administered 2015-03-21 – 2015-03-23 (×4): 3 mL via INTRAVENOUS

## 2015-03-21 MED ORDER — SENNA 8.6 MG PO TABS
1.0000 | ORAL_TABLET | Freq: Two times a day (BID) | ORAL | Status: DC
Start: 2015-03-21 — End: 2015-03-23
  Administered 2015-03-21 – 2015-03-23 (×4): 8.6 mg via ORAL
  Filled 2015-03-21 (×4): qty 1

## 2015-03-21 MED ORDER — THROMBIN 20000 UNITS EX KIT
PACK | CUTANEOUS | Status: DC | PRN
Start: 2015-03-21 — End: 2015-03-21
  Administered 2015-03-21: 20000 [IU] via TOPICAL

## 2015-03-21 MED ORDER — ACETAMINOPHEN 325 MG PO TABS: 650.0000 mg | ORAL_TABLET | ORAL | Status: DC | PRN

## 2015-03-21 MED ORDER — MIDAZOLAM HCL 5 MG/5ML IJ SOLN
INTRAMUSCULAR | Status: DC | PRN
  Administered 2015-03-21: 2 mg via INTRAVENOUS

## 2015-03-21 MED ORDER — KETOROLAC TROMETHAMINE 15 MG/ML IJ SOLN
15.0000 mg | Freq: Once | INTRAMUSCULAR | Status: AC
  Administered 2015-03-21: 15 mg via INTRAVENOUS

## 2015-03-21 MED ORDER — SODIUM CHLORIDE 0.9 % IJ SOLN: 3.0000 mL | INTRAMUSCULAR | Status: DC | PRN

## 2015-03-21 MED ORDER — HYDROCODONE-ACETAMINOPHEN 5-325 MG PO TABS: 1.0000 | ORAL_TABLET | ORAL | Status: DC | PRN

## 2015-03-21 MED ORDER — TAMSULOSIN HCL 0.4 MG PO CAPS
0.4000 mg | ORAL_CAPSULE | Freq: Every day | ORAL | Status: DC
  Administered 2015-03-21 – 2015-03-22 (×2): 0.4 mg via ORAL
  Filled 2015-03-21 (×2): qty 1

## 2015-03-21 MED ORDER — NEOSTIGMINE METHYLSULFATE 10 MG/10ML IV SOLN
INTRAVENOUS | Status: DC | PRN
  Administered 2015-03-21: 5 mg via INTRAVENOUS

## 2015-03-21 MED ORDER — CEFAZOLIN SODIUM 1-5 GM-% IV SOLN
1.0000 g | Freq: Three times a day (TID) | INTRAVENOUS | Status: AC
  Administered 2015-03-21 – 2015-03-22 (×2): 1 g via INTRAVENOUS
  Filled 2015-03-21 (×2): qty 50

## 2015-03-21 MED ORDER — ACETAMINOPHEN 650 MG RE SUPP: 650.0000 mg | RECTAL | Status: DC | PRN

## 2015-03-21 MED ORDER — VANCOMYCIN HCL 10 G IV SOLR
1500.0000 mg | INTRAVENOUS | Status: AC
  Administered 2015-03-21: 1500 mg via INTRAVENOUS
  Filled 2015-03-21 (×2): qty 1500

## 2015-03-21 MED ORDER — HEMOSTATIC AGENTS (NO CHARGE) OPTIME
TOPICAL | Status: DC | PRN
  Administered 2015-03-21: 1 via TOPICAL

## 2015-03-21 MED ORDER — LIDOCAINE-EPINEPHRINE 1 %-1:100000 IJ SOLN
INTRAMUSCULAR | Status: DC | PRN
  Administered 2015-03-21: 10 mL

## 2015-03-21 MED ORDER — GLYCOPYRROLATE 0.2 MG/ML IJ SOLN
INTRAMUSCULAR | Status: DC | PRN
  Administered 2015-03-21: .8 mg via INTRAVENOUS

## 2015-03-21 MED ORDER — OXYCODONE HCL 5 MG PO TABS
5.0000 mg | ORAL_TABLET | Freq: Once | ORAL | Status: AC | PRN
  Administered 2015-03-21: 5 mg via ORAL

## 2015-03-21 MED ORDER — SODIUM CHLORIDE 0.9 % IV SOLN: 250.0000 mL | INTRAVENOUS | Status: DC

## 2015-03-21 MED ORDER — MIDAZOLAM HCL 2 MG/2ML IJ SOLN
INTRAMUSCULAR | Status: AC
  Filled 2015-03-21: qty 2

## 2015-03-21 MED ORDER — SODIUM CHLORIDE 0.9 % IV SOLN
INTRAVENOUS | Status: DC
  Administered 2015-03-21: 22:00:00 via INTRAVENOUS

## 2015-03-21 MED ORDER — KETOROLAC TROMETHAMINE 15 MG/ML IJ SOLN
INTRAMUSCULAR | Status: AC
  Filled 2015-03-21: qty 1

## 2015-03-21 MED ORDER — THROMBIN 5000 UNITS EX SOLR
CUTANEOUS | Status: DC | PRN
  Administered 2015-03-21: 5000 [IU] via TOPICAL

## 2015-03-21 MED ORDER — FAMOTIDINE 20 MG PO TABS
20.0000 mg | ORAL_TABLET | Freq: Every day | ORAL | Status: DC
  Administered 2015-03-21 – 2015-03-23 (×3): 20 mg via ORAL
  Filled 2015-03-21 (×3): qty 1

## 2015-03-21 MED ORDER — PHENYLEPHRINE HCL 10 MG/ML IJ SOLN
INTRAMUSCULAR | Status: DC | PRN
  Administered 2015-03-21 (×4): 80 ug via INTRAVENOUS

## 2015-03-21 MED ORDER — DEXAMETHASONE SODIUM PHOSPHATE 10 MG/ML IJ SOLN
INTRAMUSCULAR | Status: DC | PRN
  Administered 2015-03-21: 10 mg via INTRAVENOUS

## 2015-03-21 MED ORDER — HYDROMORPHONE HCL 1 MG/ML IJ SOLN: 0.5000 mg | Freq: Once | INTRAMUSCULAR | Status: DC

## 2015-03-21 MED ORDER — POLYETHYLENE GLYCOL 3350 17 G PO PACK: 17.0000 g | PACK | Freq: Every day | ORAL | Status: DC | PRN

## 2015-03-21 MED ORDER — ONDANSETRON HCL 4 MG/2ML IJ SOLN
INTRAMUSCULAR | Status: DC | PRN
  Administered 2015-03-21: 4 mg via INTRAVENOUS

## 2015-03-21 MED ORDER — PROMETHAZINE HCL 25 MG/ML IJ SOLN: 6.2500 mg | INTRAMUSCULAR | Status: DC | PRN

## 2015-03-21 MED ORDER — EPHEDRINE SULFATE 50 MG/ML IJ SOLN
INTRAMUSCULAR | Status: DC | PRN
  Administered 2015-03-21 (×4): 5 mg via INTRAVENOUS
  Administered 2015-03-21 (×2): 10 mg via INTRAVENOUS

## 2015-03-21 MED ORDER — DEXAMETHASONE SODIUM PHOSPHATE 10 MG/ML IJ SOLN
INTRAMUSCULAR | Status: AC
  Filled 2015-03-21: qty 2

## 2015-03-21 MED ORDER — HYDROMORPHONE HCL 1 MG/ML IJ SOLN
INTRAMUSCULAR | Status: AC
  Filled 2015-03-21: qty 1

## 2015-03-21 MED ORDER — DOCUSATE SODIUM 100 MG PO CAPS
100.0000 mg | ORAL_CAPSULE | Freq: Two times a day (BID) | ORAL | Status: DC
  Administered 2015-03-21 – 2015-03-23 (×4): 100 mg via ORAL
  Filled 2015-03-21 (×4): qty 1

## 2015-03-21 MED ORDER — HYDROMORPHONE HCL 1 MG/ML IJ SOLN
INTRAMUSCULAR | Status: AC
  Administered 2015-03-21: 0.5 mg
  Filled 2015-03-21: qty 1

## 2015-03-21 MED ORDER — MAGNESIUM CITRATE PO SOLN
1.0000 | Freq: Once | ORAL | Status: AC | PRN
  Filled 2015-03-21: qty 296

## 2015-03-21 MED ORDER — ATORVASTATIN CALCIUM 10 MG PO TABS
10.0000 mg | ORAL_TABLET | Freq: Every day | ORAL | Status: DC
  Administered 2015-03-21 – 2015-03-23 (×3): 10 mg via ORAL
  Filled 2015-03-21 (×3): qty 1

## 2015-03-21 MED ORDER — ALUM & MAG HYDROXIDE-SIMETH 200-200-20 MG/5ML PO SUSP: 30.0000 mL | Freq: Four times a day (QID) | ORAL | Status: DC | PRN

## 2015-03-21 MED ORDER — LIDOCAINE HCL (CARDIAC) 20 MG/ML IV SOLN
INTRAVENOUS | Status: DC | PRN
  Administered 2015-03-21: 50 mg via INTRAVENOUS

## 2015-03-21 MED ORDER — BISACODYL 10 MG RE SUPP: 10.0000 mg | Freq: Every day | RECTAL | Status: DC | PRN

## 2015-03-21 MED ORDER — OXYCODONE HCL 5 MG PO TABS
ORAL_TABLET | ORAL | Status: AC
  Filled 2015-03-21: qty 1

## 2015-03-21 MED ORDER — PROPOFOL 10 MG/ML IV BOLUS
INTRAVENOUS | Status: AC
  Filled 2015-03-21: qty 20

## 2015-03-21 MED ORDER — BUPIVACAINE HCL (PF) 0.5 % IJ SOLN
INTRAMUSCULAR | Status: DC | PRN
  Administered 2015-03-21: 10 mL
  Administered 2015-03-21: 20 mL

## 2015-03-21 MED ORDER — METHOCARBAMOL 1000 MG/10ML IJ SOLN: 500.0000 mg | Freq: Four times a day (QID) | INTRAVENOUS | Status: DC | PRN

## 2015-03-21 MED ORDER — OXYCODONE HCL 5 MG/5ML PO SOLN: 5.0000 mg | Freq: Once | ORAL | Status: AC | PRN

## 2015-03-21 MED ORDER — HYDROMORPHONE HCL 1 MG/ML IJ SOLN
0.2500 mg | INTRAMUSCULAR | Status: DC | PRN
  Administered 2015-03-21 (×4): 0.5 mg via INTRAVENOUS

## 2015-03-21 MED ORDER — FENTANYL CITRATE (PF) 250 MCG/5ML IJ SOLN
INTRAMUSCULAR | Status: AC
  Filled 2015-03-21: qty 5

## 2015-03-21 MED ORDER — FENTANYL CITRATE (PF) 100 MCG/2ML IJ SOLN
INTRAMUSCULAR | Status: DC | PRN
  Administered 2015-03-21 (×2): 100 ug via INTRAVENOUS
  Administered 2015-03-21: 50 ug via INTRAVENOUS

## 2015-03-21 MED ORDER — CYCLOBENZAPRINE HCL 10 MG PO TABS
ORAL_TABLET | ORAL | Status: AC
  Filled 2015-03-21: qty 1

## 2015-03-21 MED ORDER — KETOROLAC TROMETHAMINE 15 MG/ML IJ SOLN
15.0000 mg | Freq: Four times a day (QID) | INTRAMUSCULAR | Status: AC
  Administered 2015-03-21 – 2015-03-22 (×5): 15 mg via INTRAVENOUS
  Filled 2015-03-21 (×4): qty 1

## 2015-03-21 MED ORDER — CYCLOBENZAPRINE HCL 10 MG PO TABS
10.0000 mg | ORAL_TABLET | Freq: Three times a day (TID) | ORAL | Status: DC | PRN
  Administered 2015-03-21 – 2015-03-23 (×4): 10 mg via ORAL
  Filled 2015-03-21 (×3): qty 1

## 2015-03-21 MED ORDER — ROCURONIUM BROMIDE 100 MG/10ML IV SOLN
INTRAVENOUS | Status: DC | PRN
  Administered 2015-03-21 (×3): 10 mg via INTRAVENOUS
  Administered 2015-03-21: 40 mg via INTRAVENOUS

## 2015-03-21 MED ORDER — HYDROMORPHONE HCL 1 MG/ML IJ SOLN: 0.5000 mg | INTRAMUSCULAR | Status: DC | PRN

## 2015-03-21 MED ORDER — SODIUM CHLORIDE 0.9 % IR SOLN
Status: DC | PRN
  Administered 2015-03-21: 16:00:00

## 2015-03-21 MED ORDER — PROPOFOL 10 MG/ML IV BOLUS
INTRAVENOUS | Status: DC | PRN
  Administered 2015-03-21: 200 mg via INTRAVENOUS

## 2015-03-21 MED ORDER — NEOSTIGMINE METHYLSULFATE 10 MG/10ML IV SOLN
INTRAVENOUS | Status: AC
  Filled 2015-03-21: qty 5

## 2015-03-21 MED ORDER — OXYCODONE-ACETAMINOPHEN 5-325 MG PO TABS
1.0000 | ORAL_TABLET | ORAL | Status: DC | PRN
  Administered 2015-03-22 (×3): 2 via ORAL
  Administered 2015-03-22 – 2015-03-23 (×2): 1 via ORAL
  Administered 2015-03-23: 2 via ORAL
  Filled 2015-03-21 (×3): qty 2
  Filled 2015-03-21 (×2): qty 1
  Filled 2015-03-21: qty 2

## 2015-03-21 MED ORDER — ONDANSETRON HCL 4 MG/2ML IJ SOLN: 4.0000 mg | INTRAMUSCULAR | Status: DC | PRN

## 2015-03-21 MED ORDER — PHENOL 1.4 % MT LIQD: 1.0000 | OROMUCOSAL | Status: DC | PRN

## 2015-03-21 MED ORDER — MENTHOL 3 MG MT LOZG: 1.0000 | LOZENGE | OROMUCOSAL | Status: DC | PRN

## 2015-03-21 MED ORDER — LACTATED RINGERS IV SOLN
INTRAVENOUS | Status: DC
  Administered 2015-03-21 (×4): via INTRAVENOUS

## 2015-03-21 MED ORDER — METHOCARBAMOL 500 MG PO TABS: 500.0000 mg | ORAL_TABLET | Freq: Four times a day (QID) | ORAL | Status: DC | PRN

## 2015-03-21 SURGICAL SUPPLY — 71 items
ADH SKN CLS APL DERMABOND .7 (GAUZE/BANDAGES/DRESSINGS) ×1
ADH SKN CLS LQ APL DERMABOND (GAUZE/BANDAGES/DRESSINGS) ×1
BAG DECANTER FOR FLEXI CONT (MISCELLANEOUS) ×3 IMPLANT
BLADE CLIPPER SURG (BLADE) IMPLANT
BONE MATRIX OSTEOCEL PRO MED (Bone Implant) ×4 IMPLANT
BUR MATCHSTICK NEURO 3.0 LAGG (BURR) ×3 IMPLANT
CAGE PLIF 8X9X23-12 LUMBAR (Cage) ×6 IMPLANT
CANISTER SUCT 3000ML PPV (MISCELLANEOUS) ×3 IMPLANT
CONT SPEC 4OZ CLIKSEAL STRL BL (MISCELLANEOUS) ×6 IMPLANT
COVER BACK TABLE 60X90IN (DRAPES) ×3 IMPLANT
DECANTER SPIKE VIAL GLASS SM (MISCELLANEOUS) ×3 IMPLANT
DERMABOND ADHESIVE PROPEN (GAUZE/BANDAGES/DRESSINGS) ×2
DERMABOND ADVANCED (GAUZE/BANDAGES/DRESSINGS) ×2
DERMABOND ADVANCED .7 DNX12 (GAUZE/BANDAGES/DRESSINGS) ×1 IMPLANT
DERMABOND ADVANCED .7 DNX6 (GAUZE/BANDAGES/DRESSINGS) IMPLANT
DRAPE C-ARM 42X72 X-RAY (DRAPES) ×6 IMPLANT
DRAPE LAPAROTOMY 100X72X124 (DRAPES) ×3 IMPLANT
DRAPE MICROSCOPE LEICA (MISCELLANEOUS) ×2 IMPLANT
DRAPE POUCH INSTRU U-SHP 10X18 (DRAPES) ×3 IMPLANT
DRAPE PROXIMA HALF (DRAPES) IMPLANT
DRSG OPSITE POSTOP 4X6 (GAUZE/BANDAGES/DRESSINGS) ×2 IMPLANT
DURAPREP 26ML APPLICATOR (WOUND CARE) ×3 IMPLANT
ELECT REM PT RETURN 9FT ADLT (ELECTROSURGICAL) ×3
ELECTRODE REM PT RTRN 9FT ADLT (ELECTROSURGICAL) ×1 IMPLANT
GAUZE SPONGE 4X4 12PLY STRL (GAUZE/BANDAGES/DRESSINGS) ×3 IMPLANT
GAUZE SPONGE 4X4 16PLY XRAY LF (GAUZE/BANDAGES/DRESSINGS) IMPLANT
GLOVE BIO SURGEON STRL SZ 6.5 (GLOVE) ×6 IMPLANT
GLOVE BIO SURGEONS STRL SZ 6.5 (GLOVE) ×3
GLOVE BIOGEL PI IND STRL 7.0 (GLOVE) IMPLANT
GLOVE BIOGEL PI IND STRL 8 (GLOVE) IMPLANT
GLOVE BIOGEL PI IND STRL 8.5 (GLOVE) ×2 IMPLANT
GLOVE BIOGEL PI INDICATOR 7.0 (GLOVE) ×2
GLOVE BIOGEL PI INDICATOR 8 (GLOVE) ×4
GLOVE BIOGEL PI INDICATOR 8.5 (GLOVE) ×4
GLOVE ECLIPSE 7.5 STRL STRAW (GLOVE) ×6 IMPLANT
GLOVE ECLIPSE 8.5 STRL (GLOVE) ×6 IMPLANT
GLOVE EXAM NITRILE LRG STRL (GLOVE) IMPLANT
GLOVE EXAM NITRILE MD LF STRL (GLOVE) IMPLANT
GLOVE EXAM NITRILE XL STR (GLOVE) IMPLANT
GLOVE EXAM NITRILE XS STR PU (GLOVE) IMPLANT
GOWN STRL REUS W/ TWL LRG LVL3 (GOWN DISPOSABLE) IMPLANT
GOWN STRL REUS W/ TWL XL LVL3 (GOWN DISPOSABLE) IMPLANT
GOWN STRL REUS W/TWL 2XL LVL3 (GOWN DISPOSABLE) ×6 IMPLANT
GOWN STRL REUS W/TWL LRG LVL3 (GOWN DISPOSABLE) ×3
GOWN STRL REUS W/TWL XL LVL3 (GOWN DISPOSABLE)
HEMOSTAT POWDER KIT SURGIFOAM (HEMOSTASIS) ×2 IMPLANT
KIT BASIN OR (CUSTOM PROCEDURE TRAY) ×3 IMPLANT
KIT ROOM TURNOVER OR (KITS) ×3 IMPLANT
NEEDLE HYPO 22GX1.5 SAFETY (NEEDLE) ×3 IMPLANT
NS IRRIG 1000ML POUR BTL (IV SOLUTION) ×3 IMPLANT
PACK LAMINECTOMY NEURO (CUSTOM PROCEDURE TRAY) ×3 IMPLANT
PAD ARMBOARD 7.5X6 YLW CONV (MISCELLANEOUS) ×9 IMPLANT
PATTIES SURGICAL .5 X1 (DISPOSABLE) ×3 IMPLANT
ROD RELINE-O LORD 5.5X40 (Rod) ×4 IMPLANT
RUBBERBAND STERILE (MISCELLANEOUS) ×4 IMPLANT
SCREW LOCK RELINE 5.5 TULIP (Screw) ×8 IMPLANT
SCREW RELINE-O POLY 6.5X45 (Screw) ×8 IMPLANT
SPONGE LAP 4X18 X RAY DECT (DISPOSABLE) IMPLANT
SPONGE SURGIFOAM ABS GEL 100 (HEMOSTASIS) ×3 IMPLANT
SUT VIC AB 1 CT1 18XBRD ANBCTR (SUTURE) ×2 IMPLANT
SUT VIC AB 1 CT1 8-18 (SUTURE) ×6
SUT VIC AB 2-0 CP2 18 (SUTURE) ×3 IMPLANT
SUT VIC AB 3-0 SH 8-18 (SUTURE) ×5 IMPLANT
SYR 20ML ECCENTRIC (SYRINGE) ×3 IMPLANT
SYR 3ML LL SCALE MARK (SYRINGE) ×12 IMPLANT
TOWEL OR 17X24 6PK STRL BLUE (TOWEL DISPOSABLE) ×3 IMPLANT
TOWEL OR 17X26 10 PK STRL BLUE (TOWEL DISPOSABLE) ×3 IMPLANT
TRAP SPECIMEN MUCOUS 40CC (MISCELLANEOUS) ×3 IMPLANT
TRAY FOLEY CATH 16FRSI W/METER (SET/KITS/TRAYS/PACK) ×3 IMPLANT
TRAY FOLEY W/METER SILVER 14FR (SET/KITS/TRAYS/PACK) ×1 IMPLANT
WATER STERILE IRR 1000ML POUR (IV SOLUTION) ×3 IMPLANT

## 2015-03-21 NOTE — Transfer of Care (Signed)
Immediate Anesthesia Transfer of Care Note  Patient: [name]  Procedure(s) Performed: Procedure(s) with comments: Lumbar Four-Five Posterior lumbar interbody fusion with Cages and Pedicle Screws (N/A) - Lumbar Four-Five Posterior lumbar interbody fusion with Cages and Pedicle Screws  Patient Location: PACU  Anesthesia Type:General  Level of Consciousness: awake, alert , patient cooperative and responds to stimulation  Airway & Oxygen Therapy: Patient Spontanous Breathing and Patient connected to nasal cannula oxygen  Post-op Assessment: Report given to RN and Post -op Vital signs reviewed and stable  Post vital signs: Reviewed and stable  Last Vitals:  Filed Vitals:   03/21/15 1249  BP: 131/88  Pulse: 66  Temp: 36.7 C  Resp: 20    Complications: No apparent anesthesia complications

## 2015-03-21 NOTE — Progress Notes (Signed)
Patient ID: [name], male   DOB: 1956/07/06, 59 y.o.   MRN: [id number] Shouldn't delay an alert Motor function appears intact bilaterally Dressing is clean and dry stable

## 2015-03-21 NOTE — H&P (Signed)
[name] is an 59 y.o. male.   Chief Complaint: Back and left lower extremity pain HPI: [name]'s a 59 year old individual who about 3 weeks ago underwent microdiscectomy for herniated nucleus pulposus at L4-5 on the left initially experienced good relief of the pain but subsequently has had substantial back and left lower extremity pain that is been unrelenting he also has evolved a foot drop on the left side evaluation with an MRI demonstrates presence of a recurrent disc herniation and markedly inflamed changes in the L4-L5 disc space is also noted that the patient has had a previous disc are free from 2011 L concordant both L4-5 and L5-S1 is now being admitted to undergo surgical decompression at L4-5 with stabilization using posterior interbody arthrodesis and pedicle screw fixation.  Past Medical History  Diagnosis Date  . RMSF [facility] spotted fever)   . Hypercholesterolemia   . Arthritis     BACK  . GERD (gastroesophageal reflux disease)   . Complication of anesthesia   . PONV (postoperative nausea and vomiting)     Past Surgical History  Procedure Laterality Date  . Wrist surgery  1995    right  . Vasectomy  1991, 2000  . Vasectomy reversal  1998  . Right shoulder arthroscopic acrominoplasty  07/27/2008    w/acromioclavicular resection, subcapularis debridement  . Total shoulder arthroplasty  12/26/2010    right  . Colonoscopy  11/15/11  . Hernia repair  1992    hiatal    Family History  Problem Relation Age of Onset  . Coronary artery disease Mother   . Lung cancer Mother   . Crohn's disease Brother    Social History:  reports that he has never smoked. He has quit using smokeless tobacco. He reports that he does not drink alcohol or use illicit drugs.  Allergies:  Allergies  Allergen Reactions  . Penicillins     Rash     No prescriptions prior to admission    No results found for this or any previous visit (from the past 48 hour(s)). No  results found.  Review of Systems  HENT: Negative.   Eyes: Negative.   Respiratory: Negative.   Cardiovascular: Negative.   Musculoskeletal: Positive for back pain.  Skin: Negative.   Neurological: Positive for weakness.       Buttock and lower extremity pain  Psychiatric/Behavioral: Negative.     There were no vitals taken for this visit. Physical Exam  Constitutional: He is oriented to person, place, and time. He appears well-developed and well-nourished.  HENT:  Head: Normocephalic and atraumatic.  Eyes: Conjunctivae and EOM are normal. Pupils are equal, round, and reactive to light.  Neck: Normal range of motion. Neck supple.  Cardiovascular: Normal rate and regular rhythm.   Respiratory: Effort normal and breath sounds normal.  GI: Soft. Bowel sounds are normal.  Musculoskeletal: Normal range of motion.  Moderate paravertebral spasm in the lower lumbar spine  Neurological: He is alert and oriented to person, place, and time.  Absent deep tendon reflex and left lower extremity in the patellae and the Achilles both  Skin: Skin is warm and dry.  Psychiatric: He has a normal mood and affect. His behavior is normal. Judgment and thought content normal.     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 Decompression fusion L4-5  Madiha Bambrick J 03/21/2015, 10:28 AM

## 2015-03-21 NOTE — Anesthesia Procedure Notes (Signed)
Procedure Name: Intubation Performed by: Joene Gelder J Pre-anesthesia Checklist: Patient identified, Emergency Drugs available, Suction available, Patient being monitored and Timeout performed Patient Re-evaluated:Patient Re-evaluated prior to inductionOxygen Delivery Method: Circle system utilized Preoxygenation: Pre-oxygenation with 100% oxygen Intubation Type: IV induction Ventilation: Mask ventilation without difficulty Laryngoscope Size: Mac and 4 Grade View: Grade I Tube type: Oral Tube size: 7.5 mm Number of attempts: 1 Airway Equipment and Method: Stylet Placement Confirmation: ETT inserted through vocal cords under direct vision,  positive ETCO2,  CO2 detector and breath sounds checked- equal and bilateral Secured at: 23 cm Tube secured with: Tape Dental Injury: Teeth and Oropharynx as per pre-operative assessment        

## 2015-03-21 NOTE — Anesthesia Preprocedure Evaluation (Addendum)
Anesthesia Evaluation  Patient identified by MRN, date of birth, ID band Patient awake    Reviewed: Allergy & Precautions, NPO status , Patient's Chart, lab work & pertinent test results  Airway Mallampati: I  TM Distance: >3 FB Neck ROM: Full    Dental   Pulmonary neg pulmonary ROS,  breath sounds clear to auscultation        Cardiovascular negative cardio ROS  Rhythm:Regular Rate:Normal     Neuro/Psych negative neurological ROS     GI/Hepatic Neg liver ROS, GERD-  ,  Endo/Other  Morbid obesity  Renal/GU negative Renal ROS     Musculoskeletal  (+) Arthritis -,   Abdominal   Peds  Hematology negative hematology ROS (+)   Anesthesia Other Findings   Reproductive/Obstetrics                            Anesthesia Physical Anesthesia Plan  ASA: II  Anesthesia Plan: General   Post-op Pain Management:    Induction: Intravenous  Airway Management Planned: Oral ETT  Additional Equipment:   Intra-op Plan:   Post-operative Plan: Extubation in OR  Informed Consent: I have reviewed the patients History and Physical, chart, labs and discussed the procedure including the risks, benefits and alternatives for the proposed anesthesia with the patient or authorized representative who has indicated his/her understanding and acceptance.   Dental advisory given  Plan Discussed with: CRNA  Anesthesia Plan Comments:         Anesthesia Quick Evaluation

## 2015-03-21 NOTE — Anesthesia Postprocedure Evaluation (Signed)
  Anesthesia Post-op Note  Patient: [name]  Procedure(s) Performed: Procedure(s) with comments: Lumbar Four-Five Posterior lumbar interbody fusion with Cages and Pedicle Screws (N/A) - Lumbar Four-Five Posterior lumbar interbody fusion with Cages and Pedicle Screws  Patient Location: PACU  Anesthesia Type:General  Level of Consciousness: awake, alert  and sedated  Airway and Oxygen Therapy: Patient Spontanous Breathing  Post-op Pain: mild  Post-op Assessment: Post-op Vital signs reviewed   LLE Sensation: Numbness   RLE Sensation: Full sensation      Post-op Vital Signs: stable  Last Vitals:  Filed Vitals:   03/21/15 2117  BP: 139/68  Pulse: 82  Temp: 36.6 C  Resp: 16    Complications: No apparent anesthesia complications

## 2015-03-21 NOTE — Op Note (Signed)
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  Asst.: Dr. Shirlean Kelly M.D.  Indications: Patient is [name] is a 58 y.o. male who about 3 weeks ago underwent microdiscectomy at L4-L5 on the left side for herniated nucleus pulposus. After about a week and a half she developed severe recurrent pain in the back and left lower extremity. A recent MRI demonstrates a tiny large recurrence of the disc herniation at the L4-L5 level. There is also moderate degenerative changes in the disc space. His ultimately advised that if surgery is contemplated not only should he undergo repeat decompression but he should undergo fusion and stabilization with pedicle screws.   Procedure: The patient was brought to the operating room supine on a stretcher. After the smooth induction of general endotracheal anesthesia she was turned prone and the back was prepped with alcohol and DuraPrep. The back was then draped sterilely. A midline incision was created and carried down to the lumbar dorsal fascia. A localizing radiograph identified the L4 and L5 spinous processes. A subligamentous dissection was created at L4 and L5 to expose the interlaminar space at L4 and L5 and the facet joints over the L4-L5 interspace. On the left side the previous discectomy site was explored and further disc material was immediately encountered and this was removed. Interspace was explored and there was noted to be a remarkable amount of disc material. Laminotomies were were then created removing the entire inferior margin of the lamina of L4 including the  inferior facet at the L4-L5 joint. The yellow ligament was taken up and the common dural tube was exposed along with the L4 nerve root superiorly, and the L5 nerve root inferiorly, the disc space was exposed and epidural veins in this region were cauterized and divided. The L4 nerve roots and the L5 nerve root were dissected with care taken to protect them. The disc space was opened and a combination of curettes and rongeurs was used to evacuate the disc space fully. The endplates were removed using sharp curettes. An interbody spacer was placed to distract the disc space while the contralateral discectomy was performed. When the entirety of the disc was removed and the endplates were prepared final sizing of the disc space was obtained 12 mm peek spacers were chosen and packed with autograft and allograft and placed into the interspace. The remainder of the interspace was packed with autograft and allograft. Pedicle entry sites were then chosen using fluoroscopic guidance and 6.5 x 45 mm screws were placed in L4 and 6.5 x 45 mm screws were placed in L5. The lateral gutters were decorticated and graft was packed in the posterolateral gutters between L4 and L5. Final radiographs were obtained after placing appropriately sized rods between the pedicle screws at L4-L5 and torquing these to the appropriate tension. The surgical site was inspected carefully to assure the L4 and L5 nerve roots were well decompressed, hemostasis was obtained, and the graft was well packed. Then the retractors were removed and the wound was closed with #1 Vicryl in the lumbar dorsal fascia 2-0 Vicryl in the subcutaneous tissue and 3-0 Vicryl subcuticularly. When he cc of half percent Marcaine was injected into the paraspinous musculature at the time of closure.  Blood loss was estimated at 250 cc. The patient tolerated procedure well and was returned to the recovery room in stable condition.

## 2015-03-22 MED FILL — Sodium Chloride IV Soln 0.9%: INTRAVENOUS | Qty: 1000 | Status: AC

## 2015-03-22 MED FILL — Heparin Sodium (Porcine) Inj 1000 Unit/ML: INTRAMUSCULAR | Qty: 30 | Status: AC

## 2015-03-22 NOTE — Progress Notes (Signed)
Orthopedic Tech Progress Note Patient Details:  [name] [birth year] [id number]  Patient ID: [name], male   DOB: 01-Sep-1956, 59 y.o.   MRN: [id number] Called in bio-tech brace order; spoke with Anderson Malta, [name] 03/22/2015, 9:15 AM

## 2015-03-22 NOTE — Progress Notes (Signed)
Patient received from PACU at 2100, alert and oriented. Patient oriented to room and equipment. VSS. MAEW. Honeycomb to back is clean, dry, and intact. Will monitor closely overnight.

## 2015-03-22 NOTE — Progress Notes (Signed)
Patient ID: [name], male   DOB: December 02, 1955, 59 y.o.   MRN: [id number] Vital signs are stable Motor function appears intact Dressing is dry Patient is ambulatory Hopeful for discharge tomorrow

## 2015-03-22 NOTE — Evaluation (Signed)
Physical Therapy Evaluation Patient Details Name: [name] MRN: [id number] DOB: 12/21/1955 Today's Date: 03/22/2015   History of Present Illness  patient is a 59 yo male s/p Lumbar Four-Five Posterior lumbar interbody fusion   Clinical Impression  Patient mobilizing very well. This was patients second time ambulating around the unit. Patient performed stair negotiation, bed mobility, transfers, steps and car simulation with good compliance and technique. No physical assist required. Patient educated on precautions, mobility expectations, safety and pain management strategies. Tolerated well. Will continue to see and progress as tolerated. Anticipate patient safe for dc home when medically appropriate.     Follow Up Recommendations No PT follow up    Equipment Recommendations  None recommended by PT    Recommendations for Other Services       Precautions / Restrictions Precautions Precautions: Back Precaution Booklet Issued: Yes (comment) Precaution Comments: verbally reviewed with teachback. pt recalled 3/3 precuations x2 Required Braces or Orthoses: Spinal Brace Spinal Brace: Lumbar corset Restrictions Weight Bearing Restrictions: No      Mobility  Bed Mobility Overal bed mobility: Modified Independent             General bed mobility comments: increased time to perform, no physical assist required (good compliance with precautions)  Transfers Overall transfer level: Needs assistance Equipment used: None Transfers: Sit to/from Stand Sit to Stand: Supervision         General transfer comment: no physical assist required  Ambulation/Gait Ambulation/Gait assistance: Supervision Ambulation Distance (Feet): 380 Feet Assistive device: None Gait Pattern/deviations: Step-through pattern Gait velocity: decreased Gait velocity interpretation: Below normal speed for age/gender General Gait Details: steady with ambulation, no physical assist  required  Stairs Stairs: Yes Stairs assistance: Supervision Stair Management: No rails;Two rails;Alternating pattern;Forwards Number of Stairs: 5 General stair comments: performed x2  once with rails, once without rails, cues for momentum and pace of negotiation  Wheelchair Mobility    Modified Rankin (Stroke Patients Only)       Balance Overall balance assessment: No apparent balance deficits (not formally assessed)                                           Pertinent Vitals/Pain Pain Assessment: 0-10 Pain Score: 6  Pain Location: back Pain Descriptors / Indicators: Discomfort;Operative site guarding;Sore Pain Intervention(s): Monitored during session;Repositioned;Premedicated before session;Relaxation    Home Living Family/patient expects to be discharged to:: Private residence Living Arrangements: Spouse/significant other Available Help at Discharge: Family Type of Home: House Home Access: Stairs to enter Entrance Stairs-Rails: None Entrance Stairs-Number of Steps: 3 Home Layout: Multi-level Home Equipment: None Additional Comments: walk in shower, no seat. elevated toilets    Prior Function Level of Independence: Independent               Hand Dominance   Dominant Hand: Right    Extremity/Trunk Assessment   Upper Extremity Assessment: Overall WFL for tasks assessed           Lower Extremity Assessment: Overall WFL for tasks assessed         Communication   Communication: No difficulties  Cognition Arousal/Alertness: Awake/alert Behavior During Therapy: WFL for tasks assessed/performed Overall Cognitive Status: Within Functional Limits for tasks assessed                      General Comments General comments (skin integrity, edema,  etc.): discussed mobility expectations, pain management, and safety.  patient able to perform car transfer simulation from bench with minimal cues for technique.     Exercises         Assessment/Plan    PT Assessment Patient needs continued PT services  PT Diagnosis Difficulty walking;Acute pain   PT Problem List Decreased activity tolerance;Decreased balance;Decreased mobility;Pain  PT Treatment Interventions DME instruction;Gait training;Stair training;Functional mobility training;Therapeutic activities;Therapeutic exercise;Balance training;Patient/family education   PT Goals (Current goals can be found in the Care Plan section) Acute Rehab PT Goals Patient Stated Goal: to go home PT Goal Formulation: With patient Time For Goal Achievement: 04/05/15 Potential to Achieve Goals: Good    Frequency Min 5X/week   Barriers to discharge   .    Co-evaluation               End of Session Equipment Utilized During Treatment: Gait belt;Back brace Activity Tolerance: Patient tolerated treatment well;No increased pain Patient left: in bed;with call bell/phone within reach;with bed alarm set Nurse Communication: Mobility status (transfer sign off initiated)         Time: 0721-0746 PT Time Calculation (min) (ACUTE ONLY): 25 min   Charges:   PT Evaluation $Initial PT Evaluation Tier I: 1 Procedure PT Treatments $Self Care/Home Management: 8-22   PT G CodesFabio Murphy 07-Apr-2015, 7:56 AM [name], PT DPT  [phone]

## 2015-03-22 NOTE — Evaluation (Signed)
Occupational Therapy Evaluation Patient Details Name: SANSKAR MANDELL MRN: [id number] DOB: 08/01/1956 Today's Date: 03/22/2015    History of Present Illness 59 yo male s/p PLIF L4-5   Clinical Impression   Patient evaluated by Occupational Therapy with no further acute OT needs identified. All education has been completed and the patient has no further questions. See below for any follow-up Occupational Therapy or equipment needs. OT to sign off. Thank you for referral.      Follow Up Recommendations  No OT follow up    Equipment Recommendations  None recommended by OT    Recommendations for Other Services       Precautions / Restrictions Precautions Precautions: Back Precaution Booklet Issued: Yes (comment) Precaution Comments: provided handout and reviewed back precautions in detail Required Braces or Orthoses: Spinal Brace Spinal Brace: Lumbar corset Restrictions Weight Bearing Restrictions: No      Mobility Bed Mobility Overal bed mobility: Modified Independent             General bed mobility comments: increased time to perform, no physical assist required (good compliance with precautions)  Transfers Overall transfer level: Independent Equipment used: None Transfers: Sit to/from Stand Sit to Stand: Supervision         General transfer comment: no physical assist required    Balance Overall balance assessment: No apparent balance deficits (not formally assessed)                                          ADL Overall ADL's : At baseline                                       General ADL Comments: pt dressed at eob this am and reviewed correct sequence with back precautions.      Vision     Perception     Praxis      Pertinent Vitals/Pain Pain Assessment: 0-10 Pain Score: 6  Pain Location: back Pain Descriptors / Indicators: Discomfort;Operative site guarding;Sore Pain Intervention(s): Monitored  during session;Repositioned;Premedicated before session;Relaxation     Hand Dominance Right   Extremity/Trunk Assessment Upper Extremity Assessment Upper Extremity Assessment: Overall WFL for tasks assessed   Lower Extremity Assessment Lower Extremity Assessment: Overall WFL for tasks assessed   Cervical / Trunk Assessment Cervical / Trunk Assessment: Other exceptions Cervical / Trunk Exceptions: s/p surg   Communication Communication Communication: No difficulties   Cognition Arousal/Alertness: Awake/alert Behavior During Therapy: WFL for tasks assessed/performed Overall Cognitive Status: Within Functional Limits for tasks assessed                     General Comments       Exercises       Shoulder Instructions      Home Living Family/patient expects to be discharged to:: Private residence Living Arrangements: Spouse/significant other Available Help at Discharge: Family Type of Home: House Home Access: Stairs to enter Secretary/administrator of Steps: 3 Entrance Stairs-Rails: None Home Layout: Multi-level     Bathroom Shower/Tub: Producer, television/film/video: Handicapped height     Home Equipment: None   Additional Comments: walk in shower, no seat. elevated toilets      Prior Functioning/Environment Level of Independence: Independent        Comments: has dog  at home     OT Diagnosis: Acute pain   OT Problem List:     OT Treatment/Interventions:      OT Goals(Current goals can be found in the care plan section) Acute Rehab OT Goals Patient Stated Goal: to go home  OT Frequency:     Barriers to D/C:            Co-evaluation              End of Session Nurse Communication: Mobility status;Precautions  Activity Tolerance: Patient tolerated treatment well Patient left: in chair;with call bell/phone within reach   Time: 0813-0834 OT Time Calculation (min): 21 min Charges:  OT General Charges $OT Visit: 1 Procedure OT  Evaluation $Initial OT Evaluation Tier I: 1 Procedure G-Codes:    Harolyn Rutherford 04-10-15, 8:57 AM  Pager: [phone]

## 2015-03-23 MED ORDER — OXYCODONE-ACETAMINOPHEN 5-325 MG PO TABS: 1.0000 | ORAL_TABLET | ORAL | Status: DC | PRN

## 2015-03-23 MED ORDER — DIAZEPAM 5 MG PO TABS: 5.0000 mg | ORAL_TABLET | Freq: Four times a day (QID) | ORAL | Status: DC | PRN

## 2015-03-23 NOTE — Discharge Summary (Signed)
Physician Discharge Summary  Patient ID: [name] MRN: [id number] DOB/AGE: 1956/06/22 59 y.o.  Admit date: 03/21/2015 Discharge date: 03/23/2015  Admission Diagnoses: Lumbar herniated nucleus pulposus, recurrent with radiculopathy L4-L5  Discharge Diagnoses: Recurrent herniated nucleus pulposus L4-L5 with lumbar radiculopathy on the left Active Problems:   Herniated nucleus pulposus, L4-5 left   Discharged Condition: good  Hospital Course: Patient was admitted to undergo surgical decompression arthrodesis at L4-L5. He tolerated surgery well.  Consults: None  Significant Diagnostic Studies: None  Treatments: surgery: Decompression of L4-L5 via total discectomy and decompression of L4 and L5 nerve roots with more work than require for simple listeria interbody arthrodesis. Posterior lumbar interbody arthrodesis L4-L5 pedicle screw fixation L4-L5 Osterloh lateral arthrodesis L4-L5.  Discharge Exam: Blood pressure 113/58, pulse 89, temperature 98.2 F (36.8 C), temperature source Oral, resp. rate 19, height 5\' 8"  (1.727 m), weight 111.948 kg (246 lb 12.8 oz), SpO2 95 %. Incision/Wound: Incision is clean and dry motor function is intact in lower extremities.  Disposition: 01-Home or Self Care     Medication List    ASK your doctor about these medications        aspirin 81 MG tablet  Take 81 mg by mouth daily.     atorvastatin 10 MG tablet  Commonly known as:  LIPITOR  Take 10 mg by mouth daily.     cyclobenzaprine 10 MG tablet  Commonly known as:  FLEXERIL  Take 10 mg by mouth 3 (three) times daily as needed for muscle spasms.     ibuprofen 800 MG tablet  Commonly known as:  ADVIL,MOTRIN  Take 1 tablet (800 mg total) by mouth 3 (three) times daily.     methocarbamol 500 MG tablet  Commonly known as:  ROBAXIN  Take 1 tablet (500 mg total) by mouth 2 (two) times daily as needed for muscle spasms.     oxyCODONE-acetaminophen 5-325 MG per tablet  Commonly  known as:  PERCOCET  Take 1 tablet by mouth every 4 (four) hours as needed.     PEPCID 20 MG tablet  Generic drug:  famotidine  Take 20 mg by mouth daily.     predniSONE 20 MG tablet  Commonly known as:  DELTASONE  Take 2 tablets (40 mg total) by mouth daily. Take 40 mg by mouth daily for 3 days, then 20mg  by mouth daily for 3 days, then 10mg  daily for 3 days     tamsulosin 0.4 MG Caps capsule  Commonly known as:  FLOMAX  Take 0.4 mg by mouth at bedtime.         SignedStefani Dama 03/23/2015, 9:52 AM

## 2015-03-23 NOTE — Progress Notes (Signed)
Patient discharged home wife transporting, minimal pain he is alert and oriented IV removed discharge explained and signed and prescriptions given to patient.

## 2015-10-19 MED FILL — CIALIS 5 MG TABLET: 5 | 30 days supply | Qty: 30 | Fill #1

## 2015-10-26 MED FILL — ATORVASTATIN 20 MG TABLET: 20 | 90 days supply | Qty: 90 | Fill #3

## 2015-11-21 MED FILL — CIALIS 5 MG TABLET: 5 | 30 days supply | Qty: 30 | Fill #0

## 2015-12-10 ENCOUNTER — Encounter (HOSPITAL_COMMUNITY): Payer: Self-pay | Admitting: Emergency Medicine

## 2015-12-10 ENCOUNTER — Emergency Department (HOSPITAL_COMMUNITY)
Admission: EM | Admit: 2015-12-10 | Discharge: 2015-12-11 | Disposition: A | Discharge status: Home / Self Care | Payer: BC Managed Care – PPO | Attending: Emergency Medicine | Admitting: Emergency Medicine

## 2015-12-10 DIAGNOSIS — Z7982 Long term (current) use of aspirin: Secondary | ICD-10-CM | POA: Diagnosis not present

## 2015-12-10 DIAGNOSIS — E7800 Pure hypercholesterolemia, unspecified: Secondary | ICD-10-CM | POA: Insufficient documentation

## 2015-12-10 DIAGNOSIS — R45851 Suicidal ideations: Secondary | ICD-10-CM | POA: Diagnosis present

## 2015-12-10 DIAGNOSIS — Z880 Allergy status to penicillin: Secondary | ICD-10-CM | POA: Diagnosis not present

## 2015-12-10 DIAGNOSIS — F4321 Adjustment disorder with depressed mood: Secondary | ICD-10-CM | POA: Diagnosis not present

## 2015-12-10 DIAGNOSIS — Z79899 Other long term (current) drug therapy: Secondary | ICD-10-CM | POA: Insufficient documentation

## 2015-12-10 DIAGNOSIS — M47816 Spondylosis without myelopathy or radiculopathy, lumbar region: Secondary | ICD-10-CM | POA: Insufficient documentation

## 2015-12-10 DIAGNOSIS — K219 Gastro-esophageal reflux disease without esophagitis: Secondary | ICD-10-CM | POA: Diagnosis not present

## 2015-12-10 DIAGNOSIS — Z8619 Personal history of other infectious and parasitic diseases: Secondary | ICD-10-CM | POA: Insufficient documentation

## 2015-12-10 LAB — BASIC METABOLIC PANEL
Anion gap: 11 (ref 5–15)
BUN: 15 mg/dL (ref 6–20)
CO2: 25 mmol/L (ref 22–32)
CREATININE: 0.91 mg/dL (ref 0.61–1.24)
Calcium: 9.4 mg/dL (ref 8.9–10.3)
Chloride: 104 mmol/L (ref 101–111)
Glucose, Bld: 110 mg/dL — ABNORMAL HIGH (ref 65–99)
POTASSIUM: 4.4 mmol/L (ref 3.5–5.1)
SODIUM: 140 mmol/L (ref 135–145)

## 2015-12-10 LAB — CBC WITH DIFFERENTIAL/PLATELET
Basophils Absolute: 0 10*3/uL (ref 0.0–0.1)
Basophils Relative: 0 %
EOS PCT: 1 %
Eosinophils Absolute: 0.1 10*3/uL (ref 0.0–0.7)
HCT: 41.7 % (ref 39.0–52.0)
Hemoglobin: 14.8 g/dL (ref 13.0–17.0)
LYMPHS ABS: 1.3 10*3/uL (ref 0.7–4.0)
LYMPHS PCT: 13 %
MCH: 31.4 pg (ref 26.0–34.0)
MCHC: 35.5 g/dL (ref 30.0–36.0)
MCV: 88.5 fL (ref 78.0–100.0)
MONO ABS: 0.8 10*3/uL (ref 0.1–1.0)
MONOS PCT: 7 %
Neutro Abs: 8 10*3/uL — ABNORMAL HIGH (ref 1.7–7.7)
Neutrophils Relative %: 79 %
Platelets: 242 10*3/uL (ref 150–400)
RBC: 4.71 MIL/uL (ref 4.22–5.81)
RDW: 12.6 % (ref 11.5–15.5)
WBC: 10.2 10*3/uL (ref 4.0–10.5)

## 2015-12-10 LAB — RAPID URINE DRUG SCREEN, HOSP PERFORMED
Amphetamines: NOT DETECTED
Barbiturates: NOT DETECTED
Benzodiazepines: NOT DETECTED
Cocaine: NOT DETECTED
Opiates: NOT DETECTED
Tetrahydrocannabinol: NOT DETECTED

## 2015-12-10 LAB — SALICYLATE LEVEL

## 2015-12-10 LAB — ETHANOL: Alcohol, Ethyl (B): <5 mg/dL (ref <5)

## 2015-12-10 LAB — ACETAMINOPHEN LEVEL

## 2015-12-10 MED ORDER — IBUPROFEN 800 MG PO TABS
800.0000 mg | ORAL_TABLET | Freq: Once | ORAL | Status: AC
  Administered 2015-12-10: 800 mg via ORAL
  Filled 2015-12-10: qty 1

## 2015-12-10 NOTE — ED Notes (Signed)
[place] pt located per wife request related to pt threatening to shoot himself as a result of separation/divorce.

## 2015-12-10 NOTE — BH Assessment (Addendum)
Assessment Note  [name] is a 60 year old white male that was brought to the ED due to [facility] with a plan to shot himself.  Patient reports increased depression due to his wife asking him for a divorce because of an affair that happened two years ago.    Patient and his wife reports that they have been in marital counseling since the affair two years ago.   However, the wife reports that she is not able to get past the affair and she wants a divorce.    Patient reports that he has been married for 18 years and they have been together for 21 years.  Per the patients wife the patient's gun were removed.  The patient reports that he is a retired Chartered loss adjuster and he has a lot of guns.  His wife reports that 54 guns were given to the police and three guns were given to family friends.   Patient now denies SI.  Patient denies prior suicidal ideations.  Patient denies prior psychiatric hospitalization.  Patient denies prior psychiatric medication management. Patient denies HI/Psychosis and substance abuse.  Patient denies physical, sexual or emotional abuse.     Diagnosis: Major Depressive Disorder    Past Medical History:  Past Medical History  Diagnosis Date  . RMSF [facility] spotted fever)   . Hypercholesterolemia   . Arthritis     BACK  . GERD (gastroesophageal reflux disease)   . Complication of anesthesia   . PONV (postoperative nausea and vomiting)     Past Surgical History  Procedure Laterality Date  . Wrist surgery  1995    right  . Vasectomy  1991, 2000  . Vasectomy reversal  1998  . Right shoulder arthroscopic acrominoplasty  07/27/2008    w/acromioclavicular resection, subcapularis debridement  . Total shoulder arthroplasty  12/26/2010    right  . Colonoscopy  11/15/11  . Hernia repair  1992    hiatal    Family History:  Family History  Problem Relation Age of Onset  . Coronary artery disease Mother   . Lung cancer Mother   . Crohn's disease Brother      Social History:  reports that he has never smoked. He has quit using smokeless tobacco. He reports that he does not drink alcohol or use illicit drugs.  Additional Social History:  Alcohol / Drug Use History of alcohol / drug use?: No history of alcohol / drug abuse Longest period of sobriety (when/how long): None Reported  CIWA: CIWA-Ar BP: 168/99 mmHg Pulse Rate: 72 COWS:    Allergies:  Allergies  Allergen Reactions  . Penicillins     Rash     Home Medications:  (Not in a hospital admission)  OB/GYN Status:  No LMP for male patient.  General Assessment Data Location of Assessment: [facility] TTS Assessment: In system Is this a Tele or Face-to-Face Assessment?: Face-to-Face Is this an Initial Assessment or a Re-assessment for this encounter?: Initial Assessment Marital status: Married [place] name: NA Is patient pregnant?: No Pregnancy Status: No Living Arrangements: Spouse/significant other Can pt return to current living arrangement?: Yes Admission Status: Voluntary Is patient capable of signing voluntary admission?: Yes Referral Source: Self/Family/Friend Insurance type: bcbs     Crisis Care Plan Living Arrangements: Spouse/significant other Legal Guardian:  (NA) Name of Psychiatrist: NA Name of Therapist: Marriage Counselor  Education Status Is patient currently in school?: No Current Grade: NA Highest grade of school patient has completed: NA Name of  school: NA Contact person: NA  Risk to self with the past 6 months Suicidal Ideation: Yes-Currently Present Has patient been a risk to self within the past 6 months prior to admission? : Yes Suicidal Intent: Yes-Currently Present Has patient had any suicidal intent within the past 6 months prior to admission? : Yes Is patient at risk for suicide?: Yes Suicidal Plan?: Yes-Currently Present Has patient had any suicidal plan within the past 6 months prior to admission? : Yes Specify Current Suicidal Plan:  Plan to shot himself Access to Means: Yes Specify Access to Suicidal Means: Police took 26 guns out of the home  (3 guns were given to family friends) What has been your use of drugs/alcohol within the last 12 months?: NA Previous Attempts/Gestures: No How many times?: 0 Other Self Harm Risks: None Reported Triggers for Past Attempts:  (NA) Intentional Self Injurious Behavior: None Family Suicide History: No Recent stressful life event(s): Divorce (Wife asked him for a divorce) Persecutory voices/beliefs?: No Depression: Yes Depression Symptoms: Despondent, Insomnia, Tearfulness, Guilt, Loss of interest in usual pleasures, Feeling worthless/self pity, Feeling angry/irritable Substance abuse history and/or treatment for substance abuse?: No Suicide prevention information given to non-admitted patients: Yes  Risk to Others within the past 6 months Homicidal Ideation: No Does patient have any lifetime risk of violence toward others beyond the six months prior to admission? : No Thoughts of Harm to Others: No Current Homicidal Intent: No Current Homicidal Plan: No Access to Homicidal Means: No Identified Victim: NA History of harm to others?: No Assessment of Violence: None Noted Violent Behavior Description: NA Does patient have access to weapons?: Yes (Comment) Criminal Charges Pending?: No Does patient have a court date: No Is patient on probation?: No  Psychosis Hallucinations: None noted Delusions: None noted  Mental Status Report Appearance/Hygiene: Disheveled Eye Contact: Good Motor Activity: Freedom of movement Speech: Logical/coherent Level of Consciousness: Alert Mood: Depressed, Anxious Affect: Appropriate to circumstance Anxiety Level: Minimal Thought Processes: Coherent, Relevant Judgement: Unimpaired Orientation: Person, Place, Time, Situation Obsessive Compulsive Thoughts/Behaviors: None  Cognitive Functioning Concentration: Decreased Memory: Recent  Intact, Remote Intact IQ: Average Insight: Fair Impulse Control: Fair Appetite: Fair Weight Loss: 0 Weight Gain: 0 Sleep: Decreased Total Hours of Sleep: 5 Vegetative Symptoms: None  ADLScreening [facility] Assessment Services) Patient's cognitive ability adequate to safely complete daily activities?: Yes Patient able to express need for assistance with ADLs?: No Independently performs ADLs?: Yes (appropriate for developmental age)  Prior Inpatient Therapy Prior Inpatient Therapy: No Prior Therapy Dates: NA Prior Therapy Facilty/Provider(s): NA Reason for Treatment: NA  Prior Outpatient Therapy Prior Outpatient Therapy: Yes Prior Therapy Dates: Ongoing  Prior Therapy Facilty/Provider(s): [facility] Reason for Treatment: Marriage Counseling Does patient have an ACCT team?: No Does patient have Intensive In-House Services?  : No Does patient have [facility] services? : No Does patient have P4CC services?: No  ADL Screening (condition at time of admission) Patient's cognitive ability adequate to safely complete daily activities?: Yes Is the patient deaf or have difficulty hearing?: No Does the patient have difficulty seeing, even when wearing glasses/contacts?: No Does the patient have difficulty concentrating, remembering, or making decisions?: No Patient able to express need for assistance with ADLs?: No Does the patient have difficulty dressing or bathing?: No Independently performs ADLs?: Yes (appropriate for developmental age) Does the patient have difficulty walking or climbing stairs?: No Weakness of Legs: None Weakness of Arms/Hands: None  Home Assistive Devices/Equipment Home Assistive Devices/Equipment: None    Abuse/Neglect Assessment (  Assessment to be complete while patient is alone) Physical Abuse: Denies Verbal Abuse: Denies Sexual Abuse: Denies Exploitation of patient/patient's resources: Denies Self-Neglect: Denies Values / Beliefs Cultural Requests  During Hospitalization: None Spiritual Requests During Hospitalization: None Consults Spiritual Care Consult Needed: No Social Work Consult Needed: No Merchant navy officerAdvance Directives (For Healthcare) Does patient have an advance directive?: No Would patient like information on creating an advanced directive?: No - patient declined information    Additional Information 1:1 In Past 12 Months?: No CIRT Risk: No Elopement Risk: No Does patient have medical clearance?: Yes     Disposition: Pending psych disposition.  Disposition Initial Assessment Completed for this Encounter: Yes  On Site Evaluation by:   Reviewed with Physician:    Phillip HealStevenson, Annaleia Pence [place] 12/10/2015 8:06 PM

## 2015-12-10 NOTE — ED Provider Notes (Signed)
CSN: [id number]     Arrival date & time 12/10/15  1829 History   First MD Initiated Contact with Patient 12/10/15 1833     Chief Complaint  Patient presents with  . Suicidal   HPI   [name] is a 60 y.o. male PMH significant for hypercholesterolemia brought in by sheriffs for suicidal ideations after finding out his wife wants a divorce. He was threatening to shoot himself. He denies homicidal ideations, auditory or visual hallucinations.He endorses a headache (7 out of 10 pain scale, constant, diffuse, nonradiating) and attributes this to all of the stress. He denies fevers, chills, chest pain, abdominal pain, nausea, vomiting, changes in bowel or bladder habits.  Past Medical History  Diagnosis Date  . RMSF [facility]([place] spotted fever)   . Hypercholesterolemia   . Arthritis     BACK  . GERD (gastroesophageal reflux disease)   . Complication of anesthesia   . PONV (postoperative nausea and vomiting)    Past Surgical History  Procedure Laterality Date  . Wrist surgery  1995    right  . Vasectomy  1991, 2000  . Vasectomy reversal  1998  . Right shoulder arthroscopic acrominoplasty  07/27/2008    w/acromioclavicular resection, subcapularis debridement  . Total shoulder arthroplasty  12/26/2010    right  . Colonoscopy  11/15/11  . Hernia repair  1992    hiatal   Family History  Problem Relation Age of Onset  . Coronary artery disease Mother   . Lung cancer Mother   . Crohn's disease Brother    Social History  Substance Use Topics  . Smoking status: Never Smoker   . Smokeless tobacco: Former NeurosurgeonUser     Comment: quit 15 years ago  . Alcohol Use: No    Review of Systems  Ten systems are reviewed and are negative for acute change except as noted in the HPI  Allergies  Penicillins  Home Medications   Prior to Admission medications   Medication Sig Start Date End Date Taking? Authorizing Provider  aspirin 81 MG tablet Take 81 mg by mouth daily.    Historical  Provider, MD  atorvastatin (LIPITOR) 10 MG tablet Take 10 mg by mouth daily.      Historical Provider, MD  cyclobenzaprine (FLEXERIL) 10 MG tablet Take 10 mg by mouth 3 (three) times daily as needed for muscle spasms.    Historical Provider, MD  diazepam (VALIUM) 5 MG tablet Take 1 tablet (5 mg total) by mouth every 6 (six) hours as needed for muscle spasms. 03/23/15   Barnett AbuHenry Elsner, MD  famotidine (PEPCID) 20 MG tablet Take 20 mg by mouth daily.      Historical Provider, MD  ibuprofen (ADVIL,MOTRIN) 800 MG tablet Take 1 tablet (800 mg total) by mouth 3 (three) times daily. Patient not taking: Reported on 03/17/2015 02/11/15   Eber HongBrian Miller, MD  methocarbamol (ROBAXIN) 500 MG tablet Take 1 tablet (500 mg total) by mouth 2 (two) times daily as needed for muscle spasms. Patient not taking: Reported on 03/17/2015 02/11/15   Eber HongBrian Miller, MD  oxyCODONE-acetaminophen (PERCOCET) 5-325 MG per tablet Take 1 tablet by mouth every 4 (four) hours as needed. Patient taking differently: Take 1 tablet by mouth every 4 (four) hours as needed for moderate pain or severe pain.  02/11/15   Eber HongBrian Miller, MD  oxyCODONE-acetaminophen (PERCOCET/ROXICET) 5-325 MG per tablet Take 1-2 tablets by mouth every 4 (four) hours as needed for moderate pain. 03/23/15   Barnett AbuHenry Elsner, MD  predniSONE (DELTASONE) 20 MG tablet Take 2 tablets (40 mg total) by mouth daily. Take 40 mg by mouth daily for 3 days, then  by mouth daily for 3 days, then  daily for 3 days Patient not taking: Reported on 03/17/2015 02/11/15   Eber Hong, MD  tamsulosin (FLOMAX) 0.4 MG CAPS capsule Take 0.4 mg by mouth at bedtime. 02/03/15   Historical Provider, MD   BP 168/99 mmHg  Pulse 72  Temp(Src) 98 F (36.7 C) (Oral)  Resp 18  SpO2 98% Physical Exam  Constitutional: He appears well-developed and well-nourished. No distress.  HENT:  Head: Normocephalic and atraumatic.  Eyes: Conjunctivae are normal. Right eye exhibits no discharge. Left eye exhibits no  discharge. No scleral icterus.  Neck: No tracheal deviation present.  Cardiovascular: Normal rate and regular rhythm.   Pulmonary/Chest: Effort normal. No respiratory distress.  Abdominal: Soft. He exhibits no distension.  Musculoskeletal: He exhibits no edema.  Lymphadenopathy:    He has no cervical adenopathy.  Neurological: He is alert. Coordination normal.  Skin: Skin is warm and dry. No rash noted. He is not diaphoretic. No erythema.  Psychiatric: His behavior is normal.  Depressed appearing  Nursing note and vitals reviewed.   ED Course  Procedures  Labs Review Labs Reviewed  CBC WITH DIFFERENTIAL/PLATELET - Abnormal; Notable for the following:    Neutro Abs 8.0 (*)    All other components within normal limits  BASIC METABOLIC PANEL - Abnormal; Notable for the following:    Glucose, Bld 110 (*)    All other components within normal limits  ACETAMINOPHEN LEVEL - Abnormal; Notable for the following:    Acetaminophen (Tylenol), Serum <10 (*)    All other components within normal limits  ETHANOL  URINE RAPID DRUG SCREEN, HOSP PERFORMED  SALICYLATE LEVEL   MDM   Final diagnoses:  Suicidal ideation   Patient nontoxic appearing, vital signs stable. Per [facility], patient was IVC'd prior to admission. CBC, ethanol, urine drug screen, salicylate, acetaminophen, BMP unremarkable. Headache most likely primary headache. Patient given ibuprofen. Patient is medically cleared at this time. Psych hold orders were placed. Patient may be safely transferred to psych old.  Melton Krebs, PA-C 12/11/15 0031  Rolland Porter, MD 12/19/15 [phone]

## 2015-12-10 NOTE — ED Notes (Signed)
Bed: [id number] Expected date:  Expected time:  Means of arrival:  Comments: IVC- SI/HI

## 2015-12-10 NOTE — BH Assessment (Signed)
Per Leonette Mostharles, [place] - patient will be re-assessed in the morning for a final disposition.

## 2015-12-10 NOTE — ED Notes (Signed)
Protocols not placed due to MD already assigned.

## 2015-12-11 DIAGNOSIS — F4321 Adjustment disorder with depressed mood: Secondary | ICD-10-CM | POA: Diagnosis not present

## 2015-12-11 MED ORDER — FAMOTIDINE 20 MG PO TABS
20.0000 mg | ORAL_TABLET | Freq: Every day | ORAL | Status: DC
  Administered 2015-12-11: 20 mg via ORAL
  Filled 2015-12-11: qty 1

## 2015-12-11 MED ORDER — ASPIRIN 81 MG PO CHEW
81.0000 mg | CHEWABLE_TABLET | Freq: Every day | ORAL | Status: DC
  Administered 2015-12-11: 81 mg via ORAL
  Filled 2015-12-11: qty 1

## 2015-12-11 MED ORDER — ATORVASTATIN CALCIUM 20 MG PO TABS
20.0000 mg | ORAL_TABLET | Freq: Every day | ORAL | Status: DC
  Administered 2015-12-11: 20 mg via ORAL
  Filled 2015-12-11: qty 1

## 2015-12-11 MED ORDER — LORATADINE 10 MG PO TABS
10.0000 mg | ORAL_TABLET | Freq: Every day | ORAL | Status: DC
  Administered 2015-12-11: 10 mg via ORAL
  Filled 2015-12-11: qty 1

## 2015-12-11 NOTE — Consult Note (Signed)
[facility] Face-to-Face Psychiatry Consult   Reason for Consult:  Depression, suicidal ideations Referring Physician:  EDP Patient Identification: [name] MRN:  [id number] Principal Diagnosis: Adjustment disorder with depressed mood Diagnosis:   Patient Active Problem List   Diagnosis Date Noted  . Adjustment disorder with depressed mood [F43.21] 12/11/2015    Priority: High  . Herniated nucleus pulposus, L4-5 left [M51.26] 03/21/2015  . Dyslipidemia [E78.5] 10/05/2011    Total Time spent with patient: 45 minutes  Subjective:   [name] is a 60 y.o. male patient does not warrant admission.  HPI:  Today:  [name] reports he was upset yesterday when his wife asked for a divorce, he claims he did not see it coming but should have.  Denies past suicide attempts and does not feel this way today as he has had time think.  He reports infidelity two years ago and felt like she would not be able to recover from his indiscretion.  She apparently waited until their 77 yo son finished his SAT yesterday to tell him.  Denies family history of suicide but dad had alcohol issues, this is the major reason he does not drink.  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  He plans to surround himself with friends and family through this difficult time, antidepressant encouraged with outpatient therapy-agreeable to counseling.  His wife was contacted and has no safety concerns.  She reports he has a habit of threatening suicide but also states he says he is too afraid to do it.  Last night, it escalated and she called the police.  She also had a trooper friend of his remove all the guns from the home, just in case.  Patient denies suicidal/homicidal ideations, hallucinations, and alcohol/drug abuse.  Past Psychiatric History: None  Risk to Self: Suicidal Ideation: Yes-Currently Present Suicidal Intent: Yes-Currently Present Is patient at risk for suicide?:  Yes Suicidal Plan?: Yes-Currently Present Specify Current Suicidal Plan: Plan to shot himself Access to Means: Yes Specify Access to Suicidal Means: Police took 26 guns out of the home  (3 guns were given to family friends) What has been your use of drugs/alcohol within the last 12 months?: NA How many times?: 0 Other Self Harm Risks: None Reported Triggers for Past Attempts:  (NA) Intentional Self Injurious Behavior: None Risk to Others: Homicidal Ideation: No Thoughts of Harm to Others: No Current Homicidal Intent: No Current Homicidal Plan: No Access to Homicidal Means: No Identified Victim: NA History of harm to others?: No Assessment of Violence: None Noted Violent Behavior Description: NA Does patient have access to weapons?: Yes (Comment) Criminal Charges Pending?: No Does patient have a court date: No Prior Inpatient Therapy: Prior Inpatient Therapy: No Prior Therapy Dates: NA Prior Therapy Facilty/Provider(s): NA Reason for Treatment: NA Prior Outpatient Therapy: Prior Outpatient Therapy: Yes Prior Therapy Dates: Ongoing  Prior Therapy Facilty/Provider(s): [facility] Reason for Treatment: Marriage Counseling Does patient have an ACCT team?: No Does patient have Intensive In-House Services?  : No Does patient have [facility] services? : No Does patient have P4CC services?: No  Past Medical History:  Past Medical History  Diagnosis Date  . RMSF [facility] spotted fever)   . Hypercholesterolemia   . Arthritis     BACK  . GERD (gastroesophageal reflux disease)   . Complication of anesthesia   . PONV (postoperative nausea and vomiting)     Past Surgical History  Procedure Laterality  Date  . Wrist surgery  1995    right  . Vasectomy  1991, 2000  . Vasectomy reversal  1998  . Right shoulder arthroscopic acrominoplasty  07/27/2008    w/acromioclavicular resection, subcapularis debridement  . Total shoulder arthroplasty  12/26/2010    right  .  Colonoscopy  11/15/11  . Hernia repair  1992    hiatal   Family History:  Family History  Problem Relation Age of Onset  . Coronary artery disease Mother   . Lung cancer Mother   . Crohn's disease Brother    Family Psychiatric  History: Dad with alcohol dependence Social History:  History  Alcohol Use No     History  Drug Use No    Social History   Social History  . Marital Status: Married    Spouse Name: N/A  . Number of Children: 3  . Years of Education: N/A   Occupational History  . retired    Social History Main Topics  . Smoking status: Never Smoker   . Smokeless tobacco: Former Neurosurgeon     Comment: quit 15 years ago  . Alcohol Use: No  . Drug Use: No  . Sexual Activity: Not Asked   Other Topics Concern  . None   Social History Narrative   Retired Chartered loss adjuster.   Additional Social History:    Allergies:   Allergies  Allergen Reactions  . Penicillins     Rash  Has patient had a PCN reaction causing immediate rash, facial/tongue/throat swelling, SOB or lightheadedness with hypotension: Unknown Has patient had a PCN reaction causing severe rash involving mucus membranes or skin necrosis: Unknown Has patient had a PCN reaction that required hospitalization No Has patient had a PCN reaction occurring within the last 10 years: No If all of the above answers are "NO", then may proceed with Cephalosporin use.     Labs:  Results for orders placed or performed during the hospital encounter of 12/10/15 (from the past 48 hour(s))  Ethanol     Status: None   Collection Time: 12/10/15  7:20 PM  Result Value Ref Range   Alcohol, Ethyl (B) <5 <5 mg/dL    Comment:        LOWEST DETECTABLE LIMIT FOR SERUM ALCOHOL IS 5 mg/dL FOR MEDICAL PURPOSES ONLY   CBC with Diff     Status: Abnormal   Collection Time: 12/10/15  7:20 PM  Result Value Ref Range   WBC 10.2 4.0 - 10.5 K/uL   RBC 4.71 4.22 - 5.81 MIL/uL   Hemoglobin 14.8 13.0 - 17.0 g/dL   HCT 31.2 79.7 -  53.8 %   MCV 88.5 78.0 - 100.0 fL   MCH 31.4 26.0 - 34.0 pg   MCHC 35.5 30.0 - 36.0 g/dL   RDW 72.3 57.5 - 02.6 %   Platelets 242 150 - 400 K/uL   Neutrophils Relative % 79 %   Neutro Abs 8.0 (H) 1.7 - 7.7 K/uL   Lymphocytes Relative 13 %   Lymphs Abs 1.3 0.7 - 4.0 K/uL   Monocytes Relative 7 %   Monocytes Absolute 0.8 0.1 - 1.0 K/uL   Eosinophils Relative 1 %   Eosinophils Absolute 0.1 0.0 - 0.7 K/uL   Basophils Relative 0 %   Basophils Absolute 0.0 0.0 - 0.1 K/uL  Basic metabolic panel     Status: Abnormal   Collection Time: 12/10/15  7:20 PM  Result Value Ref Range   Sodium 140 135 - 145  mmol/L   Potassium 4.4 3.5 - 5.1 mmol/L   Chloride 104 101 - 111 mmol/L   CO2 25 22 - 32 mmol/L   Glucose, Bld 110 (H) 65 - 99 mg/dL   BUN 15 6 - 20 mg/dL   Creatinine, Ser 0.91 0.61 - 1.24 mg/dL   Calcium 9.4 8.9 - 10.3 mg/dL   GFR calc non Af Amer >60 >60 mL/min   GFR calc Af Amer >60 >60 mL/min    Comment: (NOTE) The eGFR has been calculated using the CKD EPI equation. This calculation has not been validated in all clinical situations. eGFR's persistently <60 mL/min signify possible Chronic Kidney Disease.    Anion gap 11 5 - 15  Salicylate level     Status: None   Collection Time: 12/10/15  7:20 PM  Result Value Ref Range   Salicylate Lvl <7.7 2.8 - 30.0 mg/dL  Acetaminophen level     Status: Abnormal   Collection Time: 12/10/15  7:20 PM  Result Value Ref Range   Acetaminophen (Tylenol), Serum <10 (L) 10 - 30 ug/mL    Comment:        THERAPEUTIC CONCENTRATIONS VARY SIGNIFICANTLY. A RANGE OF 10-30 ug/mL MAY BE AN EFFECTIVE CONCENTRATION FOR MANY PATIENTS. HOWEVER, SOME ARE BEST TREATED AT CONCENTRATIONS OUTSIDE THIS RANGE. ACETAMINOPHEN CONCENTRATIONS >150 ug/mL AT 4 HOURS AFTER INGESTION AND >50 ug/mL AT 12 HOURS AFTER INGESTION ARE OFTEN ASSOCIATED WITH TOXIC REACTIONS.   Urine rapid drug screen (hosp performed)not at [facility]     Status: None   Collection Time:  12/10/15  8:51 PM  Result Value Ref Range   Opiates NONE DETECTED NONE DETECTED   Cocaine NONE DETECTED NONE DETECTED   Benzodiazepines NONE DETECTED NONE DETECTED   Amphetamines NONE DETECTED NONE DETECTED   Tetrahydrocannabinol NONE DETECTED NONE DETECTED   Barbiturates NONE DETECTED NONE DETECTED    Comment:        DRUG SCREEN FOR MEDICAL PURPOSES ONLY.  IF CONFIRMATION IS NEEDED FOR ANY PURPOSE, NOTIFY LAB WITHIN 5 DAYS.        LOWEST DETECTABLE LIMITS FOR URINE DRUG SCREEN Drug Class       Cutoff (ng/mL) Amphetamine      1000 Barbiturate      200 Benzodiazepine   824 Tricyclics       235 Opiates          300 Cocaine          300 THC              50     Current Facility-Administered Medications  Medication Dose Route Frequency Provider Last Rate Last Dose  . aspirin chewable tablet 81 mg  81 mg Oral Daily Sharlett Iles, MD   81 mg at 12/11/15 1053  . atorvastatin (LIPITOR) tablet 20 mg  20 mg Oral Daily Sharlett Iles, MD   20 mg at 12/11/15 1136  . famotidine (PEPCID) tablet 20 mg  20 mg Oral Daily Sharlett Iles, MD   20 mg at 12/11/15 1053  . loratadine (CLARITIN) tablet 10 mg  10 mg Oral Daily Sharlett Iles, MD   10 mg at 12/11/15 1053   Current Outpatient Prescriptions  Medication Sig Dispense Refill  . aspirin 81 MG tablet Take 81 mg by mouth daily.    Marland Kitchen atorvastatin (LIPITOR) 20 MG tablet Take 20 mg by mouth daily.  3  . cetirizine (ZYRTEC) 10 MG tablet Take 10 mg by mouth daily as needed for  allergies.    . CIALIS 5 MG tablet Take 5 mg by mouth daily.  5  . cyclobenzaprine (FLEXERIL) 10 MG tablet Take 10 mg by mouth 3 (three) times daily as needed for muscle spasms.    . famotidine (PEPCID) 20 MG tablet Take 20 mg by mouth daily.      Marland Kitchen HYDROcodone-acetaminophen (NORCO/VICODIN) 5-325 MG tablet Take 1-2 tablets by mouth daily as needed for moderate pain.   0  . Ibuprofen-Diphenhydramine HCl (ADVIL PM) 200-25 MG CAPS Take 2 tablets by  mouth daily as needed (sleep/pain).      Musculoskeletal: Strength & Muscle Tone: within normal limits Gait & Station: normal Patient leans: N/A  Psychiatric Specialty Exam: Review of Systems  Constitutional: Negative.   HENT: Negative.   Eyes: Negative.   Respiratory: Negative.   Cardiovascular: Negative.   Gastrointestinal: Negative.   Genitourinary: Negative.   Musculoskeletal: Negative.   Skin: Negative.   Neurological: Negative.   Endo/Heme/Allergies: Negative.   Psychiatric/Behavioral: Positive for depression.    Blood pressure 153/76, pulse 60, temperature 98.6 F (37 C), temperature source Oral, resp. rate 16, SpO2 95 %.There is no weight on file to calculate BMI.  General Appearance: Casual  Eye Contact::  Good  Speech:  Normal Rate  Volume:  Normal  Mood:  Depressed  Affect:  Congruent  Thought Process:  Coherent  Orientation:  Full (Time, Place, and Person)  Thought Content:  Rumination  Suicidal Thoughts:  No  Homicidal Thoughts:  No  Memory:  Immediate;   Good Recent;   Good Remote;   Good  Judgement:  Good  Insight:  Fair  Psychomotor Activity:  Normal  Concentration:  Good  Recall:  Good  Fund of Knowledge:Good  Language: Good  Akathisia:  No  Handed:  Right  AIMS (if indicated):     Assets:  Housing Leisure Time Physical Health Resilience Social Support  ADL's:  Intact  Cognition: WNL  Sleep:      Treatment Plan Summary: Daily contact with patient to assess and evaluate symptoms and progress in treatment, Medication management and Plan adjustment disorder with depressed mood:  -Crisis stabilization -Medication management:  Continue his medical medication -Individual counseling and consultation with his wife after patient permission obtained. -Outpatient resources provided with encouragement to attend  Disposition: No evidence of imminent risk to self or others at present.    Waylan Boga, NP 12/11/2015 12:46 PM Patient seen  face-to-face for psychiatric evaluation, chart reviewed and case discussed with the physician extender and developed treatment plan. Reviewed the information documented and agree with the treatment plan. Corena Pilgrim, MD

## 2015-12-11 NOTE — ED Notes (Signed)
Spouse at bedside

## 2015-12-11 NOTE — BH Assessment (Signed)
Dr Jannifer FranklinAkintayo rescinded pt's IVC. Writer put original change of commitment paperwork in IVC log in [facility] and copy in pt's chart.  Evette Cristalaroline Paige Braden Deloach, [place] Therapeutic Triage Specialist

## 2015-12-11 NOTE — ED Notes (Signed)
Psych MD and NP at bedside. 

## 2015-12-11 NOTE — BH Assessment (Signed)
Writer provided outpatient resources to pt and pt's wife who was bedside.  Evette Cristalaroline Paige Sheryn Aldaz, [place] Therapeutic Triage Specialist

## 2015-12-11 NOTE — ED Notes (Signed)
Awake. Verbally responsive. A/O x4. Resp even and unlabored. No audible adventitious breath sounds noted. ABC's intact.  

## 2015-12-11 NOTE — [facility] Suicide Risk Assessment (Signed)
Suicide Risk Assessment  Discharge Assessment   [facility]   Principal Problem: Adjustment disorder with depressed mood Discharge Diagnoses:  Patient Active Problem List   Diagnosis Date Noted  . Adjustment disorder with depressed mood [F43.21] 12/11/2015    Priority: High  . Herniated nucleus pulposus, L4-5 left [M51.26] 03/21/2015  . Dyslipidemia [E78.5] 10/05/2011    Total Time spent with patient: 45 minutes  Musculoskeletal: Strength & Muscle Tone: within normal limits Gait & Station: normal Patient leans: N/A  Psychiatric Specialty Exam: Review of Systems  Constitutional: Negative.   HENT: Negative.   Eyes: Negative.   Respiratory: Negative.   Cardiovascular: Negative.   Gastrointestinal: Negative.   Genitourinary: Negative.   Musculoskeletal: Negative.   Skin: Negative.   Neurological: Negative.   Endo/Heme/Allergies: Negative.   Psychiatric/Behavioral: Positive for depression.    Blood pressure 153/76, pulse 60, temperature 98.6 F (37 C), temperature source Oral, resp. rate 16, SpO2 95 %.There is no weight on file to calculate BMI.  General Appearance: Casual  Eye Contact::  Good  Speech:  Normal Rate  Volume:  Normal  Mood:  Depressed  Affect:  Congruent  Thought Process:  Coherent  Orientation:  Full (Time, Place, and Person)  Thought Content:  Rumination  Suicidal Thoughts:  No  Homicidal Thoughts:  No  Memory:  Immediate;   Good Recent;   Good Remote;   Good  Judgement:  Good  Insight:  Fair  Psychomotor Activity:  Normal  Concentration:  Good  Recall:  Good  Fund of Knowledge:Good  Language: Good  Akathisia:  No  Handed:  Right  AIMS (if indicated):     Assets:  Housing Leisure Time Physical Health Resilience Social Support  ADL's:  Intact  Cognition: WNL  Sleep:       Mental Status Per Nursing Assessment::   On Admission:   depression, suicidal ideations  Demographic Factors:  Male and  Caucasian  Loss Factors: Loss of significant relationship  Historical Factors: NA  Risk Reduction Factors:   Responsible for children under 58 years of age, Sense of responsibility to family, Living with another person, especially a relative and Positive social support  Continued Clinical Symptoms:  Depression, mild  Cognitive Features That Contribute To Risk:  None    Suicide Risk:  Minimal: No identifiable suicidal ideation.  Patients presenting with no risk factors but with morbid ruminations; may be classified as minimal risk based on the severity of the depressive symptoms    Plan Of Care/Follow-up recommendations:  Activity:  as tolerated Diet:  heart healthy diet  Jamielee Mchale, NP 12/11/2015, 1:00 PM

## 2015-12-11 NOTE — ED Notes (Signed)
Awake. Verbally responsive. Resp even and unlabored. ABC's intact. No behavior problems noted. NAD noted. Sitter at bedside. 

## 2015-12-21 MED FILL — CIALIS 5 MG TABLET: 5 | 30 days supply | Qty: 30 | Fill #1

## 2016-01-24 MED FILL — CIALIS 5 MG TABLET: 5 | 30 days supply | Qty: 30 | Fill #2

## 2016-02-09 MED FILL — ATORVASTATIN 20 MG TABLET: 20 | 90 days supply | Qty: 90 | Fill #0

## 2016-03-05 MED FILL — TAMSULOSIN HCL 0.4 MG CAP: 0.4 | 90 days supply | Qty: 90 | Fill #0

## 2016-05-03 MED FILL — CIALIS 5 MG TABLET: 5 | 30 days supply | Qty: 30 | Fill #3

## 2016-05-15 MED FILL — ATORVASTATIN 20 MG TABLET: 20 | 90 days supply | Qty: 90 | Fill #1

## 2016-06-19 MED FILL — TAMSULOSIN HCL 0.4 MG CAP: 0.4 | 90 days supply | Qty: 90 | Fill #1

## 2016-07-13 MED FILL — HYDROCODON-APAP 5-325: 5-325 | 7 days supply | Qty: 60 | Fill #0

## 2016-07-19 MED FILL — CIALIS 5 MG TABLET: 5 | 30 days supply | Qty: 30 | Fill #4

## 2016-07-19 MED FILL — CYCLOBENZAPRINE 10 MG TAB: 10 | 17 days supply | Qty: 50 | Fill #0

## 2016-09-03 MED FILL — ATORVASTATIN 20 MG TABLET: 20 | 90 days supply | Qty: 90 | Fill #2

## 2016-09-13 MED FILL — CIALIS 5 MG TABLET: 5 | 30 days supply | Qty: 30 | Fill #5

## 2016-09-27 MED FILL — TAMSULOSIN HCL 0.4 MG CAP: 0.4 | 90 days supply | Qty: 90 | Fill #0

## 2016-10-15 MED FILL — CIALIS 5 MG TABLET: 5 | 30 days supply | Qty: 30 | Fill #0

## 2016-12-05 MED FILL — ATORVASTATIN 20 MG TABLET: 20 | 90 days supply | Qty: 90 | Fill #0 | Status: TO

## 2016-12-05 MED FILL — CIALIS 5 MG TABLET: 5 | 90 days supply | Qty: 90 | Fill #0 | Status: TO

## 2016-12-31 MED FILL — TAMSULOSIN HCL 0.4 MG CAP: 0.4 | 30 days supply | Qty: 30 | Fill #0 | Status: TO

## 2017-02-04 MED FILL — TAMSULOSIN HCL 0.4 MG CAP: 0.4 | 30 days supply | Qty: 30 | Fill #0

## 2017-03-04 MED FILL — TAMSULOSIN HCL 0.4 MG CAP: 0.4 | 90 days supply | Qty: 90 | Fill #0

## 2017-03-11 MED FILL — ATORVASTATIN 20 MG TABLET: 20 | 90 days supply | Qty: 90 | Fill #0

## 2017-04-16 MED FILL — SERTRALINE HCL 50 MG TABLET: 50 | 30 days supply | Qty: 30 | Fill #0

## 2017-05-08 MED FILL — SERTRALINE HCL 100 MG TAB: 100 | 30 days supply | Qty: 30 | Fill #0

## 2017-06-13 MED FILL — ATORVASTATIN 20 MG TABLET: 20 | 90 days supply | Qty: 90 | Fill #1

## 2017-06-13 MED FILL — TAMSULOSIN HCL 0.4 MG CAP: 0.4 | 90 days supply | Qty: 90 | Fill #1

## 2017-06-24 MED FILL — CIALIS 5 MG TABLET: 5 | 90 days supply | Qty: 90 | Fill #0

## 2017-08-15 MED FILL — HYDROCODON-APAP 5-325: 5-325 | 5 days supply | Qty: 60 | Fill #0

## 2017-11-12 MED FILL — TAMSULOSIN HCL 0.4 MG CAP: 0.4 | 90 days supply | Qty: 90 | Fill #0

## 2017-11-12 MED FILL — TADALAFIL 5 MG TABS: 5 | 90 days supply | Qty: 90 | Fill #0

## 2017-11-12 MED FILL — ATORVASTATIN 20 MG TABLET: 20 | 90 days supply | Qty: 90 | Fill #0

## 2018-03-21 MED FILL — TADALAFIL 5 MG TABS: 5 | 90 days supply | Qty: 90 | Fill #1

## 2018-03-21 MED FILL — TAMSULOSIN HCL 0.4 MG CAP: 0.4 | 90 days supply | Qty: 90 | Fill #1

## 2018-04-22 MED FILL — CLINDAMYCIN HCL 150 MG CAPS: 150 | 1 days supply | Qty: 4 | Fill #0

## 2018-04-28 MED FILL — AZITHROMYCIN 250 MG TABS: 250 | 5 days supply | Qty: 6 | Fill #0

## 2018-04-28 MED FILL — IBUPROFEN 800 MG TAB: 800 | 4 days supply | Qty: 12 | Fill #0

## 2018-05-20 MED FILL — ATORVASTATIN CALCIUM 20 MG: 20 | 90 days supply | Qty: 90 | Fill #0

## 2018-05-28 MED FILL — CLINDAMYCIN HCL 150 MG CAPS: 150 | 1 days supply | Qty: 4 | Fill #0

## 2018-06-26 MED FILL — TADALAFIL 5 MG TABS: 5 | 90 days supply | Qty: 90 | Fill #2

## 2018-09-02 MED FILL — ATORVASTATIN CALCIUM 20 MG: 20 | 90 days supply | Qty: 90 | Fill #1

## 2018-09-25 MED FILL — TADALAFIL 5 MG TABS: 5 | 90 days supply | Qty: 90 | Fill #3

## 2018-09-25 MED FILL — TAMSULOSIN HCL 0.4 MG CAP: 0.4 | 90 days supply | Qty: 90 | Fill #2

## 2018-10-30 MED FILL — CLINDAMYCIN HCL 150 MG CAPS: 150 | 1 days supply | Qty: 4 | Fill #0

## 2018-11-13 MED FILL — ATORVASTATIN CALCIUM 20 MG: 20 | 90 days supply | Qty: 90 | Fill #0

## 2018-11-13 MED FILL — CYCLOBENZAPRINE HCL 10 MG T: 10 | 10 days supply | Qty: 30 | Fill #0

## 2018-11-13 MED FILL — CELECOXIB 200 MG CAP: 200 | 30 days supply | Qty: 30 | Fill #0

## 2018-12-26 MED FILL — TAMSULOSIN HCL 0.4 MG CAP: 0.4 | 90 days supply | Qty: 180 | Fill #0

## 2019-01-02 MED FILL — IBUPROFEN 800 MG TAB: 800 | 30 days supply | Qty: 90 | Fill #0

## 2019-01-25 MED FILL — CYCLOBENZAPRINE HCL 10 MG T: 10 | 10 days supply | Qty: 30 | Fill #1

## 2019-01-26 MED FILL — CELECOXIB 200 MG CAPSULE: 200 | 30 days supply | Qty: 30 | Fill #1

## 2019-01-28 MED FILL — HYDROCODON-APAP 5-325: 5-325 | 5 days supply | Qty: 40 | Fill #0

## 2019-02-06 MED FILL — TADALAFIL 5 MG TABS: 5 | 45 days supply | Qty: 90 | Fill #0

## 2019-02-18 MED FILL — CHLORTHALIDONE 25 MG TABLET: 25 | 30 days supply | Qty: 30 | Fill #0

## 2019-03-05 MED FILL — HYDROCODON-APAP 5-325: 5-325 | 5 days supply | Qty: 40 | Fill #0

## 2019-03-25 MED FILL — CHLORTHALIDONE 25 MG TABLET: 25 | 30 days supply | Qty: 30 | Fill #1

## 2019-05-01 MED FILL — CHLORTHALIDONE 25 MG TABLET: 25 | 30 days supply | Qty: 30 | Fill #2

## 2019-05-01 MED FILL — ATORVASTATIN 20 MG TABLET: 20 | 90 days supply | Qty: 90 | Fill #1

## 2019-05-18 MED FILL — TAMSULOSIN HCL 0.4 MG CAP: 0.4 | 90 days supply | Qty: 180 | Fill #1

## 2019-06-02 MED FILL — TADALAFIL 5 MG TABS: 5 | 45 days supply | Qty: 90 | Fill #1

## 2019-06-24 MED FILL — CHLORTHALIDONE 25 MG TABS: 25 | 30 days supply | Qty: 30 | Fill #0

## 2019-07-27 MED FILL — CHLORTHALIDONE 25 MG TABS: 25 | 30 days supply | Qty: 30 | Fill #1

## 2019-08-11 MED FILL — HYDROCODON-APAP 5-325: 5-325 | 5 days supply | Qty: 40 | Fill #0

## 2019-08-14 MED FILL — DIAZEPAM 10 MG TABS: 10 | 1 days supply | Qty: 1 | Fill #0

## 2019-08-29 MED FILL — traMADol HCL 50 MG TABS: 50 | 3 days supply | Qty: 20 | Fill #0

## 2019-09-03 MED FILL — CHLORTHALIDONE 25 MG TABS: 25 | 30 days supply | Qty: 30 | Fill #0

## 2019-09-03 MED FILL — ATORVASTATIN 20 MG TABLET: 20 | 90 days supply | Qty: 90 | Fill #0

## 2019-09-03 MED FILL — TAMSULOSIN HCL 0.4 MG CAP: 0.4 | 90 days supply | Qty: 180 | Fill #2

## 2019-09-14 MED FILL — OXYCODONE-ACETAMINOPHEN 5-3: 5-325 | 7 days supply | Qty: 28 | Fill #0

## 2019-09-14 MED FILL — EPINEPHRINE 0.3 MG AUTO-INJ: 0.3 | 30 days supply | Qty: 2 | Fill #0

## 2019-09-14 MED FILL — predniSONE 10 MG (21) TBPK: 10 | 6 days supply | Qty: 21 | Fill #0

## 2019-09-14 MED FILL — diazePAM 5 MG TABS: 5 | 7 days supply | Qty: 30 | Fill #0

## 2019-10-08 MED FILL — CHLORTHALIDONE 25 MG TABS: 25 | 30 days supply | Qty: 30 | Fill #1

## 2019-10-09 MED FILL — PREGABALIN 75 MG CAPS: 75 | 30 days supply | Qty: 60 | Fill #0

## 2019-10-27 MED FILL — CLINDAMYCIN HCL 150 MG CAPS: 150 | 2 days supply | Qty: 8 | Fill #0

## 2019-11-02 MED FILL — PREGABALIN 75 MG CAPS: 75 | 30 days supply | Qty: 60 | Fill #1

## 2019-11-02 MED FILL — TADALAFIL 5 MG TABS: 5 | 45 days supply | Qty: 90 | Fill #2

## 2019-11-02 MED FILL — CHLORTHALIDONE 25 MG TABS: 25 | 30 days supply | Qty: 30 | Fill #0

## 2019-12-10 MED FILL — TAMSULOSIN HCL 0.4 MG CAP: 0.4 | 90 days supply | Qty: 180 | Fill #3

## 2019-12-10 MED FILL — CHLORTHALIDONE 25 MG TABS: 25 | 30 days supply | Qty: 30 | Fill #0

## 2019-12-10 MED FILL — ATORVASTATIN 20 MG TABLET: 20 | 90 days supply | Qty: 90 | Fill #0

## 2020-01-22 MED FILL — GABAPENTIN 300 MG CAPSULE: 300 | 30 days supply | Qty: 90 | Fill #0

## 2020-01-22 MED FILL — CHLORTHALIDONE 25 MG TABS: 25 | 30 days supply | Qty: 30 | Fill #1

## 2020-01-25 ENCOUNTER — Other Ambulatory Visit: Payer: Self-pay | Admitting: Neurological Surgery

## 2020-01-25 ENCOUNTER — Other Ambulatory Visit (HOSPITAL_COMMUNITY): Payer: Self-pay | Admitting: Neurological Surgery

## 2020-01-25 DIAGNOSIS — M4802 Spinal stenosis, cervical region: Secondary | ICD-10-CM

## 2020-02-10 ENCOUNTER — Encounter (HOSPITAL_COMMUNITY): Payer: Self-pay

## 2020-02-10 ENCOUNTER — Other Ambulatory Visit (HOSPITAL_COMMUNITY): Payer: Self-pay | Admitting: Neurological Surgery

## 2020-02-10 ENCOUNTER — Ambulatory Visit (HOSPITAL_COMMUNITY)
Admission: RE | Admit: 2020-02-10 | Discharge: 2020-02-10 | Disposition: A | Discharge status: Home / Self Care | Payer: BC Managed Care – PPO | Source: Ambulatory Visit | Attending: Neurological Surgery | Admitting: Neurological Surgery

## 2020-02-10 ENCOUNTER — Other Ambulatory Visit: Payer: Self-pay

## 2020-02-10 DIAGNOSIS — M48061 Spinal stenosis, lumbar region without neurogenic claudication: Secondary | ICD-10-CM | POA: Insufficient documentation

## 2020-02-10 DIAGNOSIS — M4802 Spinal stenosis, cervical region: Secondary | ICD-10-CM

## 2020-02-10 DIAGNOSIS — R531 Weakness: Secondary | ICD-10-CM | POA: Insufficient documentation

## 2020-02-10 DIAGNOSIS — R52 Pain, unspecified: Secondary | ICD-10-CM

## 2020-02-10 DIAGNOSIS — Z981 Arthrodesis status: Secondary | ICD-10-CM | POA: Insufficient documentation

## 2020-02-10 DIAGNOSIS — R200 Anesthesia of skin: Secondary | ICD-10-CM | POA: Diagnosis not present

## 2020-02-10 DIAGNOSIS — M79641 Pain in right hand: Secondary | ICD-10-CM | POA: Insufficient documentation

## 2020-02-10 DIAGNOSIS — M79642 Pain in left hand: Secondary | ICD-10-CM | POA: Diagnosis not present

## 2020-02-10 DIAGNOSIS — M4712 Other spondylosis with myelopathy, cervical region: Secondary | ICD-10-CM | POA: Insufficient documentation

## 2020-02-10 DIAGNOSIS — M4316 Spondylolisthesis, lumbar region: Secondary | ICD-10-CM | POA: Insufficient documentation

## 2020-02-10 DIAGNOSIS — M5136 Other intervertebral disc degeneration, lumbar region: Secondary | ICD-10-CM | POA: Insufficient documentation

## 2020-02-10 MED ORDER — LIDOCAINE HCL (PF) 1 % IJ SOLN
5.0000 mL | Freq: Once | INTRAMUSCULAR | Status: AC
  Administered 2020-02-10: 08:00:00 5 mL via INTRADERMAL

## 2020-02-10 MED ORDER — OXYCODONE-ACETAMINOPHEN 5-325 MG PO TABS
1.0000 | ORAL_TABLET | ORAL | Status: DC | PRN
  Administered 2020-02-10: 1 via ORAL

## 2020-02-10 MED ORDER — ONDANSETRON HCL 4 MG/2ML IJ SOLN: 4.0000 mg | Freq: Four times a day (QID) | INTRAMUSCULAR | Status: DC | PRN

## 2020-02-10 MED ORDER — IOHEXOL 300 MG/ML  SOLN
10.0000 mL | Freq: Once | INTRAMUSCULAR | Status: AC | PRN
  Administered 2020-02-10: 08:00:00 7 mL via INTRATHECAL

## 2020-02-10 MED ORDER — DIAZEPAM 5 MG PO TABS
10.0000 mg | ORAL_TABLET | Freq: Once | ORAL | Status: AC
  Administered 2020-02-10: 10 mg via ORAL
  Filled 2020-02-10: qty 2

## 2020-02-10 MED ORDER — OXYCODONE-ACETAMINOPHEN 5-325 MG PO TABS
ORAL_TABLET | ORAL | Status: AC
  Filled 2020-02-10: qty 1

## 2020-02-10 NOTE — Procedures (Signed)
[name] is a 64 year old individual whose had significant problems with pain and weakness in his lower extremities having had a spondylolisthesis that was decompressed years ago he then developed a significant disc herniation at C5-6 underwent surgical decompression was ultimately found to have rather advanced carpal tunnel syndrome.  He underwent decompression of his carpal tunnels but despite this he notes that he has had progressive worsening of numbness and tingling in his arms and hands with a radicular type of pain syndrome after careful evaluation I advised myelography and a post myelogram CAT scan to look at the areas of his cervical spine but also to reexamine his lumbar spine as he has been having increasing numbness in his lower extremities.  Pre op Dx: Cervical myelopathy, history of lumbar spondylolisthesis Post op Dx: Same Procedure: Total myelogram Surgeon: Deonna Krummel Puncture level: L3-4 Fluid color: Clear colorless Injection: Iohexol 300, 7 mL Findings: Minimal spondylosis in the lumbar spine.  Suggestion of mild to moderate stenosis of the cervical spine further evaluation with CT scanning of the cervical and lumbar spines.

## 2020-02-10 NOTE — Progress Notes (Addendum)
Discharge instructions reviewed with pt and his girlfriend (via telephone) [facility] [phone]

## 2020-02-10 NOTE — Discharge Instructions (Signed)
Myelogram  A myelogram is an imaging test. This test checks for problems in the spinal cord and the places where nerves attach to the spinal cord (nerve roots). A dye (contrast material) is put into your spine before the X-ray. This provides a clearer image for your doctor to see. You may need this test if you have a spinal cord problem that cannot be diagnosed with other imaging tests. You may also have this test to check your spine after surgery. Tell a doctor about:  Any allergies you have, especially to iodine.  All medicines you are taking, including vitamins, herbs, eye drops, creams, and over-the-counter medicines.  Any problems you or family members have had with anesthetic medicines or dye.  Any blood disorders you have.  Any surgeries you have had.  Any medical conditions you have or have had, including asthma.  Whether you are pregnant or may be pregnant. What are the risks? Generally, this is a safe procedure. However, problems may occur, including:  Infection.  Bleeding.  Allergic reaction to medicines or dyes.  Damage to your spinal cord or nerves.  Leaking of spinal fluid. This can cause a headache.  Damage to kidneys.  Seizures. This is rare. What happens before the procedure?  Follow instructions from your doctor about what you cannot eat or drink. You may be asked to drink more fluids.  Ask your doctor about changing or stopping your normal medicines. This is important if you take diabetes medicines or blood thinners.  Plan to have someone take you home from the hospital or clinic.  If you will be going home right after the procedure, plan to have someone with you for 24 hours. What happens during the procedure?  You will lie face down on a table.  Your doctor will find the best injection site on your spine. This is most often in the lower back.  This area will be washed with soap.  You will be given a medicine to numb the area (local  anesthetic).  Your doctor will place a long needle into the space around your spinal cord.  A sample of spinal fluid may be taken. This may be sent to the lab for testing.  The dye will be injected into the space around your spinal cord.  The exam table may be tilted. This helps the dye flow up or down your spine.  The X-ray will take images of your spinal cord.  A bandage (dressing) may be placed over the area where the dye was injected. The procedure may vary among doctors and hospitals. What can I expect after the procedure?  You may be monitored until you leave the hospital or clinic. This includes checking your blood pressure, heart rate, breathing rate, and blood oxygen level.  You may feel sore at the injection site. You may have a mild headache.  You will be told to lie flat with your head raised (elevated). This lowers the risk of a headache.  It is up to you to get the results of your procedure. Ask your doctor, or the department that is doing the procedure, when your results will be ready. Follow these instructions at home:   Rest as told by your doctor. Lie flat with your head slightly elevated.  Do not bend, lift, or do hard work for 24-48 hours, or as told by your doctor.  Take over-the-counter and prescription medicines only as told by your doctor.  Take care of your bandage as told by your   doctor.  Drink enough fluid to keep your pee (urine) pale yellow.  Bathe or shower as told by your doctor. Contact a doctor if:  You have a fever.  You have a headache that lasts longer than 24 hours.  You feel sick to your stomach (nauseous).  You vomit.  Your neck is stiff.  Your legs feel numb.  You cannot pee.  You cannot poop (no bowel movement).  You have a rash.  You are itchy or sneezing. Get help right away if:  You have new symptoms or your symptoms get worse.  You have a seizure.  You have trouble breathing. Summary  A myelogram is an  imaging test that checks for problems in the spinal cord and the places where nerves attach to the spinal cord (nerve roots).  Before the procedure, follow instructions from your doctor. You will be told what not to eat or drink, or what medicines to change or stop.  After the procedure, you will be told to lie flat with your head raised (elevated). This will lower your risk of a headache.  Do not bend, lift, or do any hard work for 24-48 hours, or as told by your doctor.  Contact a doctor if you have a stiff neck or numb legs. Get help right away if your symptoms get worse, or you have a seizure or trouble breathing. This information is not intended to replace advice given to you by your health care provider. Make sure you discuss any questions you have with your health care provider. Document Revised: 11/26/2018 Document Reviewed: 11/27/2018 Elsevier Patient Education  2020 [employer]  

## 2020-02-15 MED FILL — TADALAFIL 5 MG TABS: 5 | 45 days supply | Qty: 90 | Fill #0

## 2020-02-19 MED FILL — GABAPENTIN 300 MG CAPSULE: 300 | 30 days supply | Qty: 90 | Fill #1

## 2020-02-22 MED FILL — CHLORTHALIDONE 25 MG TABS: 25 | 30 days supply | Qty: 30 | Fill #2

## 2020-03-19 MED FILL — ATORVASTATIN 20 MG TABLET: 20 | 90 days supply | Qty: 90 | Fill #1

## 2020-03-19 MED FILL — CHLORTHALIDONE 25 MG TABS: 25 | 30 days supply | Qty: 30 | Fill #3

## 2020-03-19 MED FILL — GABAPENTIN 300 MG CAPSULE: 300 | 30 days supply | Qty: 90 | Fill #2

## 2020-03-21 ENCOUNTER — Other Ambulatory Visit (HOSPITAL_COMMUNITY): Payer: Self-pay | Admitting: Urology

## 2020-03-21 MED FILL — TAMSULOSIN HCL 0.4 MG CAP: 0.4 | 90 days supply | Qty: 180 | Fill #0

## 2020-03-31 ENCOUNTER — Ambulatory Visit: Payer: BC Managed Care – PPO | Admitting: Neurology

## 2020-04-07 MED FILL — TADALAFIL 5 MG TABS: 5 | 45 days supply | Qty: 90 | Fill #1

## 2020-05-02 MED FILL — CHLORTHALIDONE 25 MG TABS: 25 | 30 days supply | Qty: 30 | Fill #4

## 2020-05-03 MED FILL — GABAPENTIN 300 MG CAPSULE: 300 | 30 days supply | Qty: 90 | Fill #0

## 2020-05-04 ENCOUNTER — Encounter: Payer: Self-pay | Admitting: Neurology

## 2020-05-04 ENCOUNTER — Ambulatory Visit: Payer: BC Managed Care – PPO | Admitting: Neurology

## 2020-05-04 VITALS — BP 115/72 | HR 52 | Ht 68.0 in | Wt 216.5 lb

## 2020-05-04 DIAGNOSIS — R200 Anesthesia of skin: Secondary | ICD-10-CM | POA: Diagnosis not present

## 2020-05-04 DIAGNOSIS — Z981 Arthrodesis status: Secondary | ICD-10-CM

## 2020-05-04 DIAGNOSIS — Z9889 Other specified postprocedural states: Secondary | ICD-10-CM | POA: Insufficient documentation

## 2020-05-04 DIAGNOSIS — G5602 Carpal tunnel syndrome, left upper limb: Secondary | ICD-10-CM | POA: Diagnosis not present

## 2020-05-04 MED ORDER — GABAPENTIN 600 MG PO TABS
600.0000 mg | ORAL_TABLET | Freq: Three times a day (TID) | ORAL | 5 refills | Status: DC
Start: 2020-05-04 — End: 2021-04-10

## 2020-05-04 NOTE — Progress Notes (Signed)
[facility]  PATIENT: [name] DOB: March 15, 1956  REFERRING DOCTOR OR PCP: Kristeen Miss, MD; Christella Noa, MD (PCP) SOURCE: Patient, notes from neurosurgery  _________________________________   HISTORICAL  CHIEF COMPLAINT:  Chief Complaint  Patient presents with  . New Patient (Initial Visit)    RM 13, alone. Paper referral from Dr. Ellene Route for polyneuropathy. Reports pain in left hand and toes on both feet. Sx started about a year ago. No hx of diabetes.     HISTORY OF PRESENT ILLNESS:  I had the pleasure of seeing patient, [name], at [facility] for neurologic consultation regarding continued left hand pain and numbness as well as foot numbness.  He is a 64 year old man who has had numbness and pain in the left hand and numbness in both feet.   The numbness and pain in the left hand is mostly in the palm and the first 4 fingers.    He notes more tingling at night and it sometimes awakens him.   He denies any weakness in the hand.      Due to pain and stiffness in the left hand and a NCV/EMG study 02/05/2019 showing absent median sensory responses and prolonged median motor distal latencies he was diagnosed with carpal tunnel syndrome.  The study was reviewed.  There did not appear to be superimposed radiculopathy.  He had a carpal tunnel surgery 03/2019 (Dr. Ellene Route).    He continues to experience pain after the surgery that he felt was unchanged.  He had significant degenerative changes at C5-C6 and underwent decompression and arthroplasty at C5-C6 in late 2020.  Unfortunately, he continues to have numbness and stiffness in the left hand and was referred for NCV/EMG study.     Although there was no improvement in the pain, he had a repeat NCV/EMG which showed improvement of median nerve conduction.  Because of the continuing symptoms, he had CT myelogram of the cervical and lumbar spine on 02/10/2020.  I reviewed the actual images.   The C5-C6 disc arthroplasty is well-positioned.  There did not appear to be significant degenerative changes above or below the surgical level.  In the lumbar spine, there was disc protrusion, facet hypertrophy and ligamenta flava hypertrophy at L5-S1 causing bilateral foraminal narrowing and lateral recess stenosis.  There was potential to affect either of the S1 nerve roots.  It also showed L4-L5 cuff with solid arthrodesis.  There was borderline spinal stenosis at L3-L4.  Currently, he is experiencing pain and numbness in the left hand.  With the arms held by his side or in front of him, he does not note any pain higher up on the left or in the right arm.  However, if arms are raised and he coughs, he gets tingling in both hands.   He sometimes gets some radiating pain down the spine bending his neck.  He has had some back pain off and on that is not constant.  He notes numbness only at the bottoms of both feet near the toes.  This involves the medial and lateral sole   He has no numbness on top of the foot.   He denies any weakness in his feet.    He has had lumbar surgery at L4L5 in the past..        REVIEW OF SYSTEMS: Constitutional: No fevers, chills, sweats, or change in appetite Eyes: No visual changes, double vision, eye pain Ear, nose and throat: No hearing loss, ear pain,  nasal congestion, sore throat Cardiovascular: No chest pain, palpitations Respiratory: No shortness of breath at rest or with exertion.   No wheezes GastrointestinaI: No nausea, vomiting, diarrhea, abdominal pain, fecal incontinence Genitourinary: No dysuria, urinary retention or frequency.  No nocturia. Musculoskeletal:As above. Integumentary: No rash, pruritus, skin lesions Neurological: as above Psychiatric: No depression at this time.  No anxiety Endocrine: No palpitations, diaphoresis, change in appetite, change in weigh or increased thirst Hematologic/Lymphatic: No anemia, purpura,  petechiae. Allergic/Immunologic: No itchy/runny eyes, nasal congestion, recent allergic reactions, rashes  ALLERGIES: Allergies  Allergen Reactions  . Penicillins     Rash  Has patient had a PCN reaction causing immediate rash, facial/tongue/throat swelling, SOB or lightheadedness with hypotension: Unknown Has patient had a PCN reaction causing severe rash involving mucus membranes or skin necrosis: Unknown Has patient had a PCN reaction that required hospitalization No Has patient had a PCN reaction occurring within the last 10 years: No If all of the above answers are "NO", then may proceed with Cephalosporin use.     HOME MEDICATIONS:  Current Outpatient Medications:  .  aspirin 81 MG tablet, Take 81 mg by mouth daily., Disp: , Rfl:  .  atorvastatin (LIPITOR) 20 MG tablet, Take 20 mg by mouth daily., Disp: , Rfl: 3 .  cetirizine (ZYRTEC) 10 MG tablet, Take 10 mg by mouth daily as needed for allergies., Disp: , Rfl:  .  CIALIS 5 MG tablet, Take 5 mg by mouth daily., Disp: , Rfl: 5 .  cyclobenzaprine (FLEXERIL) 10 MG tablet, Take 10 mg by mouth 3 (three) times daily as needed for muscle spasms., Disp: , Rfl:  .  famotidine (PEPCID) 20 MG tablet, Take 20 mg by mouth daily.  , Disp: , Rfl:  .  HYDROcodone-acetaminophen (NORCO/VICODIN) 5-325 MG tablet, Take 1-2 tablets by mouth daily as needed for moderate pain. , Disp: , Rfl: 0 .  Ibuprofen-Diphenhydramine HCl (ADVIL PM) 200-25 MG CAPS, Take 2 tablets by mouth at bedtime as needed (sleep/pain). , Disp: , Rfl:  .  tamsulosin (FLOMAX) 0.4 MG CAPS capsule, Take 0.8 mg by mouth daily., Disp: , Rfl:  .  gabapentin (NEURONTIN) 600 MG tablet, Take 1 tablet (600 mg total) by mouth 3 (three) times daily., Disp: 90 tablet, Rfl: 5  PAST MEDICAL HISTORY: Past Medical History:  Diagnosis Date  . Arthritis    BACK  . Complication of anesthesia   . GERD (gastroesophageal reflux disease)   . Hypercholesterolemia   . PONV (postoperative nausea  and vomiting)   . RMSF [facility] spotted fever)     PAST SURGICAL HISTORY: Past Surgical History:  Procedure Laterality Date  . BACK SURGERY  2016   fusion- L4/L5  . COLONOSCOPY  11/15/11  . HERNIA REPAIR  1992   hiatal  . right shoulder arthroscopic acrominoplasty  07/27/2008   w/acromioclavicular resection, subcapularis debridement  . TOTAL SHOULDER ARTHROPLASTY  12/26/2010   right  . VASECTOMY  1991, 2000  . VASECTOMY REVERSAL  1998  . WRIST SURGERY  1995   right    FAMILY HISTORY: Family History  Problem Relation Age of Onset  . Coronary artery disease Mother   . Lung cancer Mother   . Crohn's disease Brother   . Stroke Maternal Grandfather     SOCIAL HISTORY:  Social History   Socioeconomic History  . Marital status: Single    Spouse name: Not on file  . Number of children: 3  . Years of education: Not on file  .  Highest education level: Not on file  Occupational History  . Occupation: retired    Fish farm manager: [employer]  Tobacco Use  . Smoking status: Never Smoker  . Smokeless tobacco: Former Systems developer  . Tobacco comment: quit 15 years ago  Substance and Sexual Activity  . Alcohol use: No    Comment: socially  . Drug use: No  . Sexual activity: Not on file  Other Topics Concern  . Not on file  Social History Narrative   Lives with fiance   Caffeine use: coffee daily   Right handed    Retired Emergency planning/management officer.   Social Determinants of Health   Financial Resource Strain:   . Difficulty of Paying Living Expenses:   Food Insecurity:   . Worried About Charity fundraiser in the Last Year:   . Arboriculturist in the Last Year:   Transportation Needs:   . Film/video editor (Medical):   Marland Kitchen Lack of Transportation (Non-Medical):   Physical Activity:   . Days of Exercise per Week:   . Minutes of Exercise per Session:   Stress:   . Feeling of Stress :   Social Connections:   . Frequency of Communication with Friends and Family:   . Frequency of Social  Gatherings with Friends and Family:   . Attends Religious Services:   . Active Member of Clubs or Organizations:   . Attends Archivist Meetings:   Marland Kitchen Marital Status:   Intimate Partner Violence:   . Fear of Current or Ex-Partner:   . Emotionally Abused:   Marland Kitchen Physically Abused:   . Sexually Abused:      PHYSICAL EXAM  Vitals:   05/04/20 1028  BP: 115/72  Pulse: (!) 52  Weight: 216 lb 8 oz (98.2 kg)  Height: _0  (1.727 m)    Body mass index is 32.92 kg/m.   General: The patient is well-developed and well-nourished and in no acute distress  HEENT:  Head is [place]/AT.  Sclera are anicteric.  Funduscopic exam shows normal optic discs and retinal vessels.  Neck: No carotid bruits are noted.  The neck is nontender.  Cardiovascular: The heart has a regular rate and rhythm with a normal S1 and S2. There were no murmurs, gallops or rubs.    Skin: Extremities are without rash or  edema.  Musculoskeletal:  Back is nontender  Neurologic Exam  Mental status: The patient is alert and oriented x 3 at the time of the examination. The patient has apparent normal recent and remote memory, with an apparently normal attention span and concentration ability.   Speech is normal.  Cranial nerves: Extraocular movements are full. Pupils are equal, round, and reactive to light and accomodation.  Visual fields are full.  Facial symmetry is present. There is good facial sensation to soft touch bilaterally.Facial strength is normal.  Trapezius and sternocleidomastoid strength is normal. No dysarthria is noted.  The tongue is midline, and the patient has symmetric elevation of the soft palate. No obvious hearing deficits are noted.  Motor:  Muscle bulk is normal.   Tone is normal. Strength is  5 / 5 in all 4 extremities.   Sensory: He reported fairly normal sensation in both hands.  He had mild reduced sensation in the plantar aspect of both feet near the toes but normal sensation in the  dorsal aspect of the foot.  Vibration sensation was near normal in the toes.  Coordination: Cerebellar testing reveals good finger-nose-finger and  heel-to-shin bilaterally.  Gait and station: Station is normal.   Gait is normal. Tandem gait is normal. Romberg is negative.   Reflexes: Deep tendon reflexes are symmetric and normal bilaterally.   Plantar responses are flexor.      ASSESSMENT AND PLAN  Numbness - Plan: Vitamin B12, Multiple Myeloma Panel (SPEP&IFE w/QIG), Sjogren's syndrome antibods(ssa + ssb)  Left carpal tunnel syndrome  History of lumbar fusion  History of cervical spinal surgery  History of carpal tunnel surgery of left wrist   In summary, Mr. Steenson is a 64 year old man with pain and numbness in the left hand and numbness in both feet.  The symptoms in the left arm would be consistent with carpal tunnel syndrome.  Unfortunately, he did not get a benefit from surgery.  The numbness in the feet is mild.  This could be due to the degenerative changes at L5-S1 or a polyneuropathy.  Usually, numbness from radiculopathy would also be associated with some pain.  Therefore, we will check blood work for inflammatory polyneuropathy and B12 deficiency.  I will have him try an increased dose of gabapentin to 600 mg 3 times a day.  If he gets no benefit, we can set up an NCV/EMG study.  He will return to see me based on his response to the increased dose of gabapentin and the lab work.  He should call if he has new or worsening symptoms.  Thank you for asking me to see Mr. Raether.  Please let me know if I can be of further assistance with him or other patients.    Airika Alkhatib A. Felecia Shelling, MD, [facility] 12/31/2818, 8:13 PM Certified in Neurology, Clinical Neurophysiology, Sleep Medicine and Neuroimaging  [facility] [address], [place], [place] [address] [phone]

## 2020-05-05 MED FILL — GABAPENTIN 600 MG TABLET: 600 | 30 days supply | Qty: 90 | Fill #0

## 2020-05-09 LAB — MULTIPLE MYELOMA PANEL, SERUM
Albumin SerPl Elph-Mcnc: 3.9 g/dL (ref 2.9–4.4)
Albumin/Glob SerPl: 1.4 (ref 0.7–1.7)
Alpha 1: 0.3 g/dL (ref 0.0–0.4)
Alpha2 Glob SerPl Elph-Mcnc: 0.7 g/dL (ref 0.4–1.0)
B-Globulin SerPl Elph-Mcnc: 1.1 g/dL (ref 0.7–1.3)
Gamma Glob SerPl Elph-Mcnc: 0.9 g/dL (ref 0.4–1.8)
Globulin, Total: 3 g/dL (ref 2.2–3.9)
IgA/Immunoglobulin A, Serum: 328 mg/dL (ref 61–437)
IgG (Immunoglobin G), Serum: 803 mg/dL (ref 603–1613)
IgM (Immunoglobulin M), Srm: 60 mg/dL (ref 20–172)
Total Protein: 6.9 g/dL (ref 6.0–8.5)

## 2020-05-09 LAB — VITAMIN B12: Vitamin B-12: 1322 pg/mL — ABNORMAL HIGH (ref 232–1245)

## 2020-05-09 LAB — SJOGREN'S SYNDROME ANTIBODS(SSA + SSB)
ENA SSA (RO) Ab: <0.2 AI (ref 0.0–0.9)
ENA SSB (LA) Ab: <0.2 AI (ref 0.0–0.9)

## 2020-05-10 ENCOUNTER — Telehealth: Payer: Self-pay | Admitting: *Deleted

## 2020-05-10 NOTE — Telephone Encounter (Signed)
Called and spoke with pt about normal lab results per Dr. Epimenio Foot note. Pt verbalized understanding.

## 2020-05-10 NOTE — Telephone Encounter (Signed)
-----   Message from Asa Lente, MD sent at 05/09/2020  5:01 PM EDT ----- Please let him know that the lab work was normal.

## 2020-05-18 MED FILL — TADALAFIL 5 MG TABS: 5 | 45 days supply | Qty: 90 | Fill #2

## 2020-06-18 MED FILL — CHLORTHALIDONE 25 MG TABS: 25 | 30 days supply | Qty: 30 | Fill #5

## 2020-07-06 MED FILL — TADALAFIL 5 MG TABS: 5 | 45 days supply | Qty: 90 | Fill #0

## 2020-08-08 MED FILL — ATORVASTATIN CALCIUM 20 MG: 20 | 90 days supply | Qty: 90 | Fill #0

## 2020-09-02 MED FILL — GABAPENTIN 600 MG TABLET: 600 | 30 days supply | Qty: 90 | Fill #1

## 2020-09-02 MED FILL — TAMSULOSIN HCL 0.4 MG CAP: 0.4 | 90 days supply | Qty: 180 | Fill #1

## 2020-09-12 MED FILL — TADALAFIL 5 MG TABS: 5 | 45 days supply | Qty: 90 | Fill #1

## 2020-11-19 MED FILL — TADALAFIL 5 MG TABS: 5 | 45 days supply | Qty: 90 | Fill #2 | Status: TO

## 2021-02-04 ENCOUNTER — Other Ambulatory Visit (HOSPITAL_COMMUNITY): Payer: Self-pay

## 2021-04-10 ENCOUNTER — Telehealth: Payer: Self-pay | Admitting: Neurology

## 2021-04-10 MED ORDER — GABAPENTIN 600 MG PO TABS: 600.0000 mg | ORAL_TABLET | Freq: Three times a day (TID) | ORAL | 2 refills | Status: DC

## 2021-04-10 NOTE — Telephone Encounter (Signed)
E-scribed refill as requested. 

## 2021-04-10 NOTE — Telephone Encounter (Signed)
Pt request refill gabapentin (NEURONTIN) 600 MG tablet at [facility] [address] [place], [place]. Scheduled appt 06/29/21

## 2021-05-24 ENCOUNTER — Other Ambulatory Visit: Payer: Self-pay

## 2021-06-29 ENCOUNTER — Ambulatory Visit: Payer: BC Managed Care – PPO | Admitting: Neurology

## 2021-06-29 ENCOUNTER — Encounter: Payer: Self-pay | Admitting: Neurology

## 2021-06-29 VITALS — BP 159/85 | HR 48 | Ht 68.0 in | Wt 242.5 lb

## 2021-06-29 DIAGNOSIS — Z981 Arthrodesis status: Secondary | ICD-10-CM

## 2021-06-29 DIAGNOSIS — R200 Anesthesia of skin: Secondary | ICD-10-CM

## 2021-06-29 DIAGNOSIS — G5602 Carpal tunnel syndrome, left upper limb: Secondary | ICD-10-CM | POA: Diagnosis not present

## 2021-06-29 DIAGNOSIS — Z9889 Other specified postprocedural states: Secondary | ICD-10-CM

## 2021-06-29 MED ORDER — GABAPENTIN 600 MG PO TABS: 600.0000 mg | ORAL_TABLET | Freq: Three times a day (TID) | ORAL | 2 refills | Status: DC

## 2021-06-29 NOTE — Progress Notes (Signed)
[facility]  PATIENT: [name] DOB: July 20, 1956  REFERRING DOCTOR OR PCP: Barnett Abu, MD; Silvestre Moment, MD (PCP) SOURCE: Patient, notes from neurosurgery  _________________________________   HISTORICAL  CHIEF COMPLAINT:  Chief Complaint  Patient presents with   Follow-up    Rm 1,alone. Here for medication f/u. Last seen 05/04/20. Reports no change since last OV. Needs refill on gabapentin or would like to try something else.     HISTORY OF PRESENT ILLNESS:  [name], is a 65 y.o. man with left hand pain and numbness as well as foot numbness.  Update 06/29/2021: He had numbness and pain in the left hand and numbness in both feet since 2020   The numbness and pain in the left hand mostly in the palm and the 3rd and 4th fingers     He notes more tingling at night and it sometimes awakens him.   He denies any weakness in the hand.    He does not have pain.      He also has numbness and mild dysesthesias (like sock rolled up under sole) in both feet.  Currently, he is experiencing pain and numbness in the left hand.  With the arms held by his side or in front of him, he does not note any pain higher up on the left or in the right arm.  However, if arms are raised and he coughs, he gets tingling in both hands.   He sometimes gets some radiating pain down the spine bending his neck.  He has had some back pain off and on that is not constant.  He notes numbness only at the bottoms of both feet near the toes.  This involves the medial and lateral sole   He has no numbness on top of the foot.   He denies any weakness in his feet.    He has had lumbar surgery at L4L5 in the past..       HAND NUMBNESS He had numbness and pain in the left hand and numbness in both feet since 2020   The numbness and pain in the left hand is mostly in the palm and the first 4 fingers.    He notes more tingling at night and it sometimes awakens him.   He had a carpal tunnel  surgery 03/2019 (Dr. Danielle Dess).     He continued to experience pain after the surgery that he felt was unchanged.  He had significant degenerative changes at C5-C6 and underwent decompression and arthroplasty at C5-C6 in late 2020.  Unfortunately, he continues to have numbness and stiffness in the left hand and was referred for NCV/EMG study.     Although there was no improvement in the pain, he had a repeat NCV/EMG which showed improvement of median nerve conduction.    STUDIES: Due to pain and stiffness in the left hand and a NCV/EMG study 02/05/2019 showing absent median sensory responses and prolonged median motor distal latencies he was diagnosed with carpal tunnel syndrome.  The study was reviewed.  There did not appear to be superimposed radiculopathy.    CT myelogram of the cervical and lumbar spine on 02/10/2020: The C5-C6 disc arthroplasty is well-positioned.  There did not appear to be significant degenerative changes above or below the surgical level.  In the lumbar spine, there was disc protrusion, facet hypertrophy and ligamenta flava hypertrophy at L5-S1 causing bilateral foraminal narrowing and lateral recess stenosis.  There was potential to affect either of the  S1 nerve roots.  It also showed L4-L5 cuff with solid arthrodesis.  There was borderline spinal stenosis at L3-L4.    REVIEW OF SYSTEMS: Constitutional: No fevers, chills, sweats, or change in appetite Eyes: No visual changes, double vision, eye pain Ear, nose and throat: No hearing loss, ear pain, nasal congestion, sore throat Cardiovascular: No chest pain, palpitations Respiratory:  No shortness of breath at rest or with exertion.   No wheezes GastrointestinaI: No nausea, vomiting, diarrhea, abdominal pain, fecal incontinence Genitourinary:  No dysuria, urinary retention or frequency.  No nocturia. Musculoskeletal: As above. Integumentary: No rash, pruritus, skin lesions Neurological: as above Psychiatric: No depression at  this time.  No anxiety Endocrine: No palpitations, diaphoresis, change in appetite, change in weigh or increased thirst Hematologic/Lymphatic:  No anemia, purpura, petechiae. Allergic/Immunologic: No itchy/runny eyes, nasal congestion, recent allergic reactions, rashes  ALLERGIES: Allergies  Allergen Reactions   Penicillins     Rash  Has patient had a PCN reaction causing immediate rash, facial/tongue/throat swelling, SOB or lightheadedness with hypotension: Unknown Has patient had a PCN reaction causing severe rash involving mucus membranes or skin necrosis: Unknown Has patient had a PCN reaction that required hospitalization No Has patient had a PCN reaction occurring within the last 10 years: No If all of the above answers are "NO", then may proceed with Cephalosporin use.     HOME MEDICATIONS:  Current Outpatient Medications:    aspirin 81 MG tablet, Take 81 mg by mouth daily., Disp: , Rfl:    atorvastatin (LIPITOR) 20 MG tablet, Take 20 mg by mouth daily., Disp: , Rfl: 3   cetirizine (ZYRTEC) 10 MG tablet, Take 10 mg by mouth daily as needed for allergies., Disp: , Rfl:    CIALIS 5 MG tablet, Take 5 mg by mouth daily., Disp: , Rfl: 5   famotidine (PEPCID) 20 MG tablet, Take 20 mg by mouth daily.  , Disp: , Rfl:    Ibuprofen-diphenhydrAMINE HCl 200-25 MG CAPS, Take 2 tablets by mouth at bedtime as needed (sleep/pain). , Disp: , Rfl:    gabapentin (NEURONTIN) 600 MG tablet, Take 1 tablet (600 mg total) by mouth 3 (three) times daily., Disp: 90 tablet, Rfl: 2  PAST MEDICAL HISTORY: Past Medical History:  Diagnosis Date   Arthritis    BACK   Complication of anesthesia    GERD (gastroesophageal reflux disease)    Hypercholesterolemia    PONV (postoperative nausea and vomiting)    RMSF [facility] spotted fever)     PAST SURGICAL HISTORY: Past Surgical History:  Procedure Laterality Date   BACK SURGERY  2016   fusion- L4/L5   COLONOSCOPY  11/15/11   HERNIA REPAIR   1992   hiatal   right shoulder arthroscopic acrominoplasty  07/27/2008   w/acromioclavicular resection, subcapularis debridement   TOTAL SHOULDER ARTHROPLASTY  12/26/2010   right   VASECTOMY  1991, 2000   VASECTOMY REVERSAL  1998   WRIST SURGERY  1995   right    FAMILY HISTORY: Family History  Problem Relation Age of Onset   Coronary artery disease Mother    Lung cancer Mother    Crohn's disease Brother    Stroke Maternal Grandfather     SOCIAL HISTORY:  Social History   Socioeconomic History   Marital status: Single    Spouse name: Not on file   Number of children: 3   Years of education: Not on file   Highest education level: Not on file  Occupational History  Occupation: retired    Associate Professor: [employer]  Tobacco Use   Smoking status: Never   Smokeless tobacco: Former   Tobacco comments:    quit 15 years ago  Substance and Sexual Activity   Alcohol use: No    Comment: socially   Drug use: No   Sexual activity: Not on file  Other Topics Concern   Not on file  Social History Narrative   Lives with fiance   Caffeine use: coffee daily   Right handed    Retired Chartered loss adjuster.   Social Determinants of Health   Financial Resource Strain: Not on file  Food Insecurity: Not on file  Transportation Needs: Not on file  Physical Activity: Not on file  Stress: Not on file  Social Connections: Not on file  Intimate Partner Violence: Not on file     PHYSICAL EXAM  Vitals:   06/29/21 1111  BP: (!) 159/85  Pulse: (!) 48  SpO2: 95%  Weight: 242 lb 8 oz (110 kg)  Height: 5\' 8"  (1.727 m)    Body mass index is 36.87 kg/m.   General: The patient is well-developed and well-nourished and in no acute distress  HEENT:  Head is [place]/AT.  Sclera are anicteric.   Neck: No carotid bruits are noted.  The neck has good range of motion  Skin: Extremities are without rash or  edema.   Neurologic Exam  Mental status: The patient is alert and oriented x 3 at the time  of the examination. The patient has apparent normal recent and remote memory, with an apparently normal attention span and concentration ability.   Speech is normal.  Cranial nerves: Extraocular movements are full.  Facial strength and sensation was normal.  No obvious hearing deficits are noted.  Motor:  Muscle bulk is normal.   Tone is normal. Strength is  5 / 5 in all 4 extremities.   Sensory: He reported fairly normal sensation in both hands to pinprick, despite the sensation of numbness.  He had mild reduced sensation in the plantar aspect of both feet near the toes but normal sensation in the dorsal aspect of the foot.  Vibration sensation was normal in the toes.  Coordination: Cerebellar testing reveals good finger-nose-finger and heel-to-shin bilaterally.  Gait and station: Station is normal.   Gait is normal. Tandem gait is normal. Romberg is negative.   Reflexes: Deep tendon reflexes are symmetric and normal bilaterally.        ASSESSMENT AND PLAN  Numbness  Left carpal tunnel syndrome  History of cervical spinal surgery  History of carpal tunnel surgery of left wrist  History of lumbar fusion  Numbness of foot   He has residual numbness without pain in the left hand.  Median innervated hand muscles were strong.  I discussed with him that his symptoms are likely due to sequela of his severe carpal tunnel syndrome.  Unfortunately, since he has not improved much over the last year he may continue to experience some numbness in the left hand.  We could consider stopping the gabapentin as he does not have pain.  However, if he sleeps better with the gabapentin he could change it to just nighttime use. He has a little numbness at the balls of the feet bilaterally this has not changed any in the last year.  There is a mild dysesthetic sensation but it is not troubling.  We discussed that if these symptoms worsen or involve a larger part of the foot that  I would want to check an  NCV/EMG.  However, since the symptoms are very mild we will hold off on testing at this time He will return to see me as needed if he has new or worsening neurologic symptoms.    Angelina Neece A. Epimenio Foot, MD, [facility] 06/29/2021, 12:13 PM Certified in Neurology, Clinical Neurophysiology, Sleep Medicine and Neuroimaging  [facility] [address] [place], [place] [address] [phone]

## 2021-10-01 ENCOUNTER — Other Ambulatory Visit: Payer: Self-pay | Admitting: Neurology

## 2021-10-03 ENCOUNTER — Telehealth: Payer: Self-pay | Admitting: Neurology

## 2021-10-03 MED ORDER — GABAPENTIN 600 MG PO TABS: 600.0000 mg | ORAL_TABLET | Freq: Three times a day (TID) | ORAL | 2 refills | Status: DC

## 2021-10-03 NOTE — Telephone Encounter (Signed)
Request refill for gabapentin (NEURONTIN) 600 MG tablet at [id number]

## 2021-10-03 NOTE — Telephone Encounter (Signed)
Refill is being sent to the pharmacy as requested for the pt

## 2021-10-28 IMAGING — RF DG MYELOGRAM 2+ REGIONS
10 series · 10 of 10 positions shown · non-contrast
Comparison: none

CLINICAL DATA: Tingling in the arms. History of cervical disc
arthroplasty. Lumbar back pain with history of fusion.
TECHNIQUE: Contiguous axial images were obtained through the Cervical,
Thoracic, and Lumbar spine after the intrathecal infusion of
infusion. Coronal and sagittal reconstructions were obtained of the
axial image sets.

[Series 1: cp_standard · 0.26mm/px · 1 of 1 slices shown (1 of 3)]
[im 1/1]
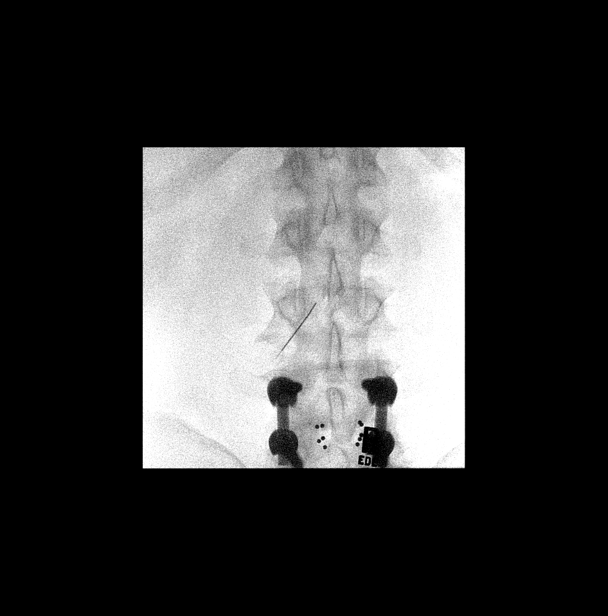

[Series 2: cp_standard · 0.27mm/px · 1 of 1 slices shown (2 of 3)]
[im 1/1]
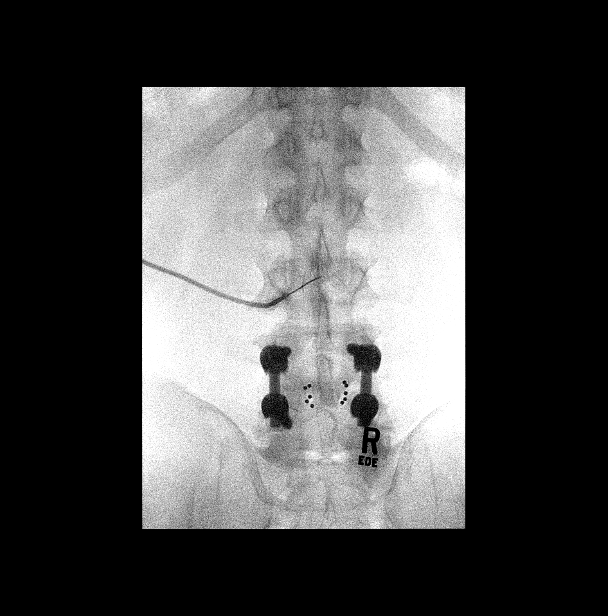

[Series 3: cp_standard · 0.26mm/px · 1 of 1 slices shown (3 of 3)]
[im 1/1]
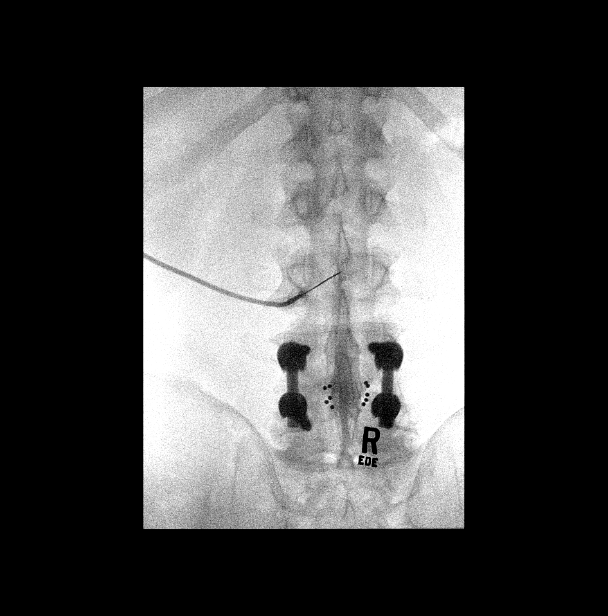

[Series 4: fluoro_myelogram_singleshot_bw · 0.17mm/px · 1 of 1 slices shown (1 of 7)]
[im 1/1]
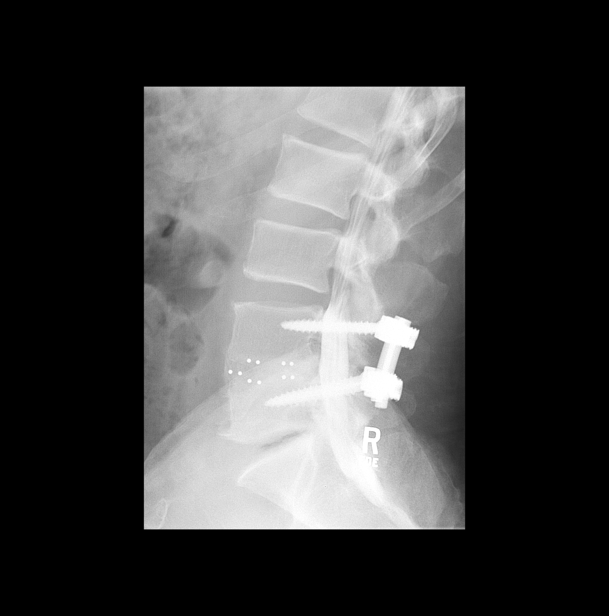

[Series 5: fluoro_myelogram_singleshot_bw · 0.17mm/px · 1 of 1 slices shown (2 of 7)]
[im 1/1]
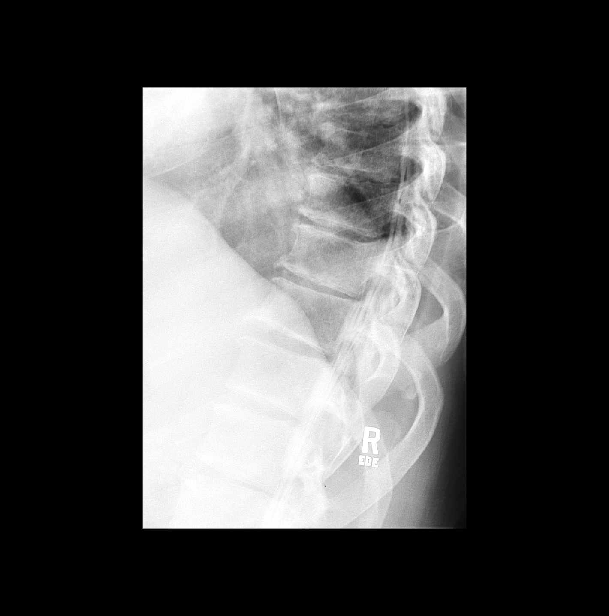

[Series 6: fluoro_myelogram_singleshot_bw · 0.17mm/px · 1 of 1 slices shown (3 of 7)]
[im 1/1]
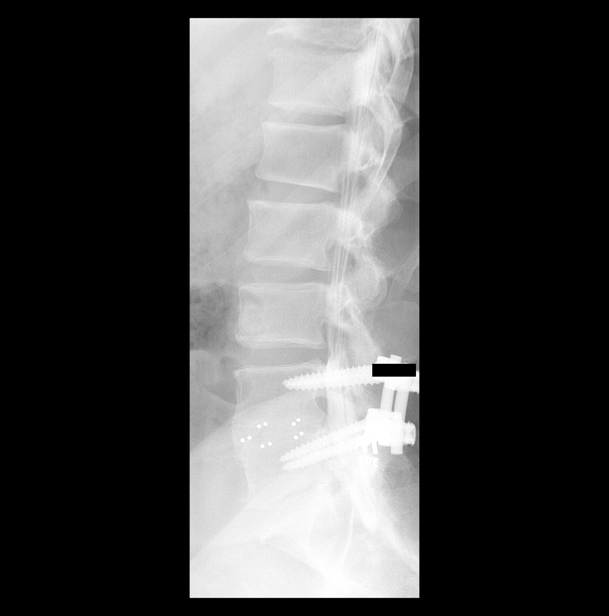

[Series 7: fluoro_myelogram_singleshot_bw · 0.17mm/px · 1 of 1 slices shown (4 of 7)]
[im 1/1]
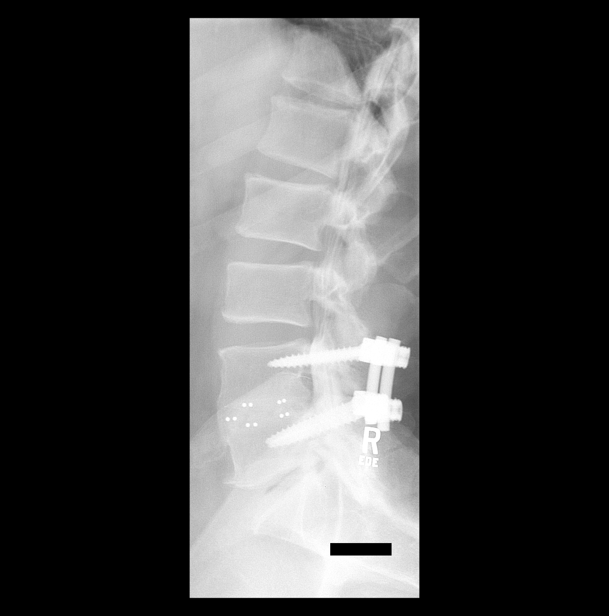

[Series 8: fluoro_myelogram_singleshot_bw · 0.17mm/px · 1 of 1 slices shown (5 of 7)]
[im 1/1]
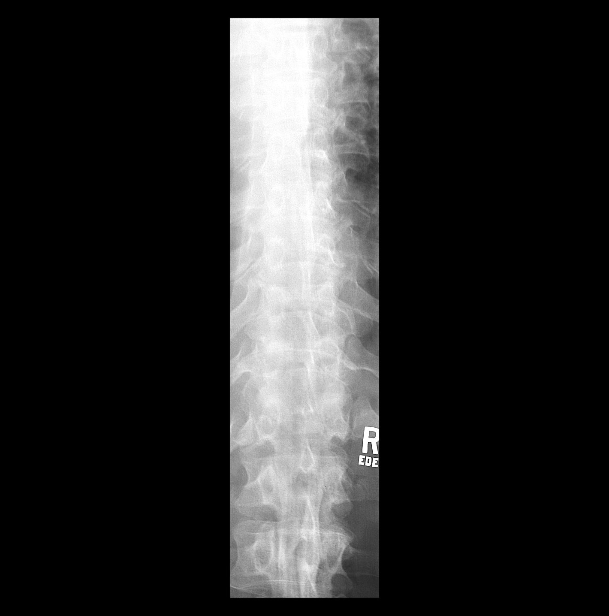

[Series 9: fluoro_myelogram_singleshot_bw · 0.17mm/px · 1 of 1 slices shown (6 of 7)]
[im 1/1]
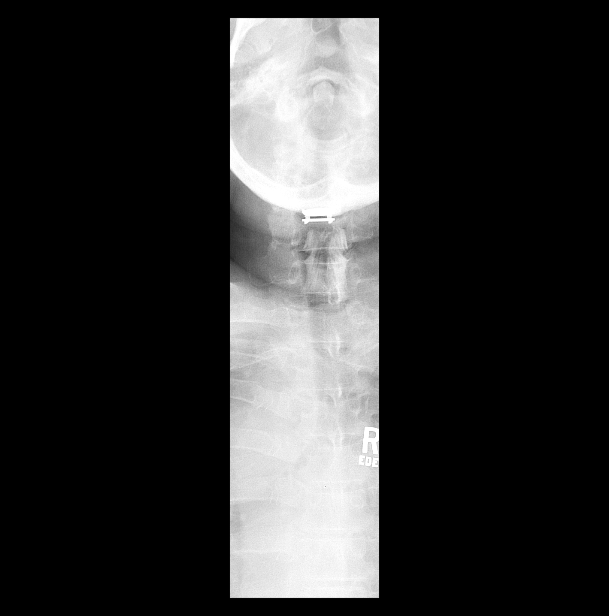

[Series 10: fluoro_myelogram_singleshot_bw · 0.17mm/px · 1 of 1 slices shown (7 of 7)]
[im 1/1]
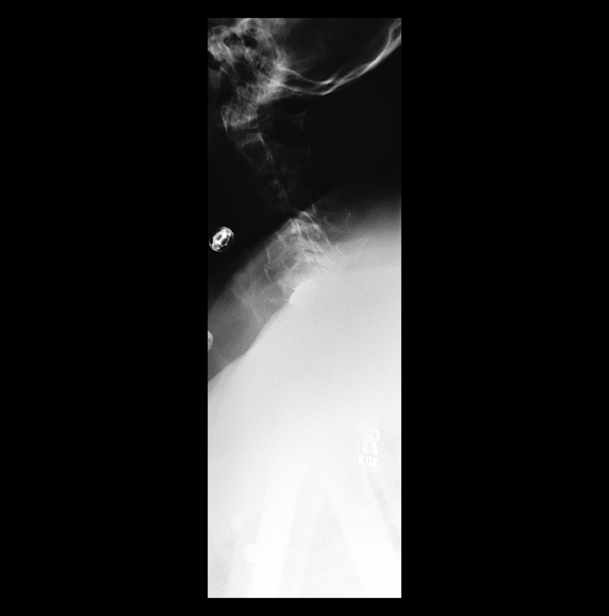

[10 of 10 positions shown; findings below may reference images not displayed]

FLUOROSCOPY TIME:  Reference procedure note

PROCEDURE:
CERVICAL AND LUMBAR MYELOGRAM

CT CERVICAL MYELOGRAM

CT LUMBAR MYELOGRAM

Lumbar puncture and intrathecal contrast administration were
performed by Dr. Rexgase who will separately report for the portion
of the procedure. I personally supervised acquisition of the
myelogram images.
FINDINGS: CERVICAL AND LUMBAR MYELOGRAM FINDINGS:

Solid PLIF at L4-5. Moderate disc degeneration at L5-S1. L3-4 disc
bulging and posterior element hypertrophy with mild spinal stenosis
which was slightly more prominent during extension.

No impediment to flow through the thoracic region.

Cervical imaging was very difficult due to broad shoulders and a
shoulder arthroplasty. Flexion and extension was not attempted as
this would be low yield when compared to the radiation dose.

CT CERVICAL MYELOGRAM FINDINGS:

Comparison is made with cervical MRI 08/25/2019

Alignment: Straight cervical spine.  No listhesis.

Vertebrae: C5-6 disc arthroplasty that is well seated. No evidence
of fracture, bone lesion, or facet erosion.

Extra-spinal: No explanation for symptoms.

Cord: Normal shape and volume. No abnormal intrathecal filling
defect.

Disc levels:

C2-3: Unremarkable.

C3-4: Minor disc narrowing with right sided endplate spurring. No
impingement

C4-5: Minor disc narrowing and tiny spurs.  No impingement

C5-6: Disc arthroplasty that is well seated.  No impingement

C6-7: Minimal ventral spondylitic change.  No impingement

C7-T1:Early facet spurring.  Negative disc space.  No impingement

CT LUMBAR MYELOGRAM FINDINGS:

Comparison is made with 07/13/2016 MRI

Segmentation: 5 lumbar type vertebrae based on the lowest ribs.

Alignment: Slight retrolisthesis at L5-S1, stable.

Vertebrae: L4-5 PLIF with solid arthrodesis. No evidence of fracture
or bone lesion.

Conus: Tip terminates at L1-2. No abnormal intrathecal filling
defect or nerve root distortion.

Extra-spinal: Mild atherosclerotic calcifications.

Disc levels:

L3-L4: Mild disc narrowing and bulging. Posterior ligamentous
thickening and mild epidural fat expansion. Mild spinal stenosis.
The foramina are patent.

L4-L5: PLIF with solid arthrodesis and no impingement.

L5-S1:Advanced disc degeneration with extensive gas containing disc
cleft, endplate ridging, and endplate sclerosis. The facets are
hypertrophic on both sides and there is biforaminal neural
impingement. Right paracentral disc protrusion which is detectable
due to gas in this location, presumably contacting the S1 nerve
root.
IMPRESSION: Cervical spine:

1. Well-positioned C5-6 disc arthroplasty.
2. No focal or notable degenerative changes. No impingement or cord
deformity to explain bilateral upper extremity symptoms.

Lumbar spine:

1. L5-S1 advanced disc and facet degeneration with biforaminal
impingement. There is a right paracentral protrusion which could
affect the descending S1 nerve root as well.
2. L4-5 PLIF with solid arthrodesis and no residual impingement.
3. L3-4 mild degenerative spinal stenosis.

## 2021-10-28 IMAGING — CT CT L SPINE W/ CM
3 series · 11 of 33 positions shown, 13 images · IV contrast (Omni 300)
Comparison: none

CLINICAL DATA: Tingling in the arms. History of cervical disc
arthroplasty. Lumbar back pain with history of fusion.
TECHNIQUE: Contiguous axial images were obtained through the Cervical,
Thoracic, and Lumbar spine after the intrathecal infusion of
infusion. Coronal and sagittal reconstructions were obtained of the
axial image sets.

[Series 4: l-spine 2.0 st · axial · 0.34mm/px · z∈[-142,+18]mm · 3 of 131 slices shown, 4 images]
[im 31/131  soft-tissue]
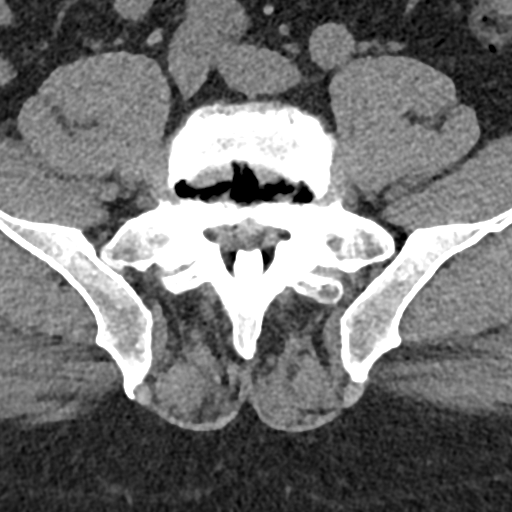
[im 31/131  bone]
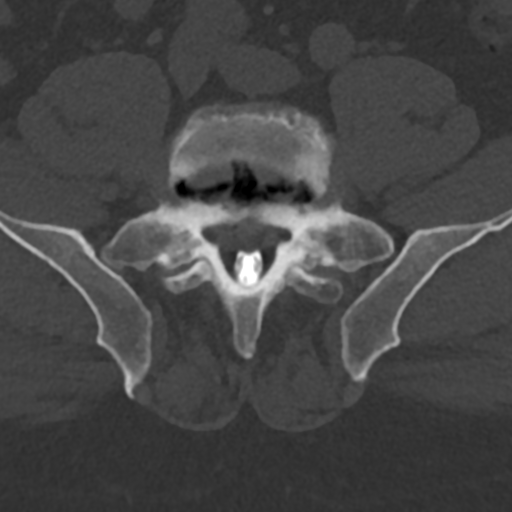
[im 71/131  bone]
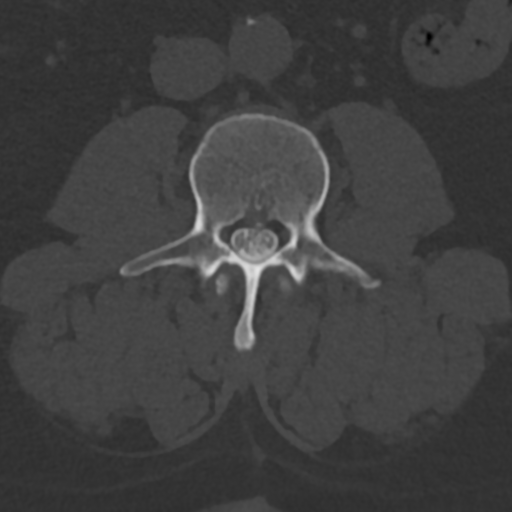
[im 111/131  bone]
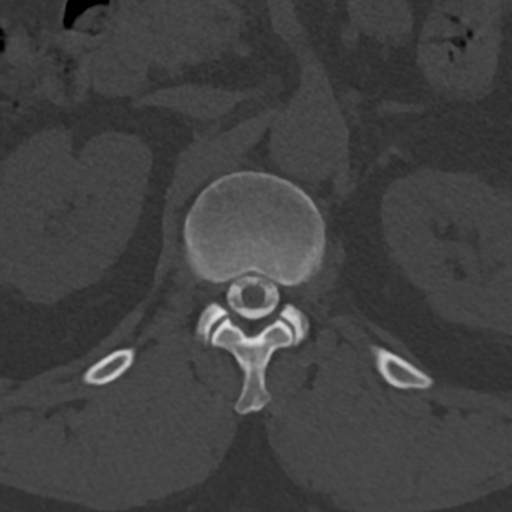

[Series 8: l-spine 2.0 cor bone · coronal · 0.29mm/px · 3 of 80 slices shown]
[im 16/80  bone]
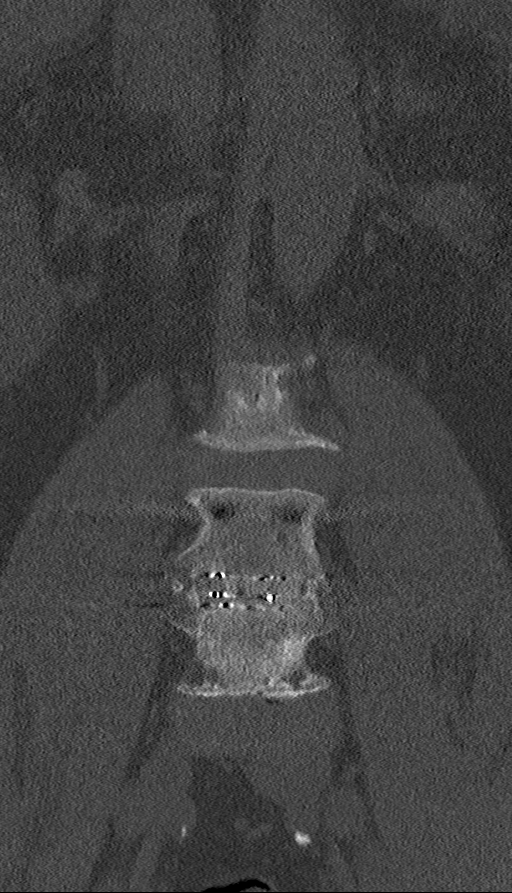
[im 32/80  bone]
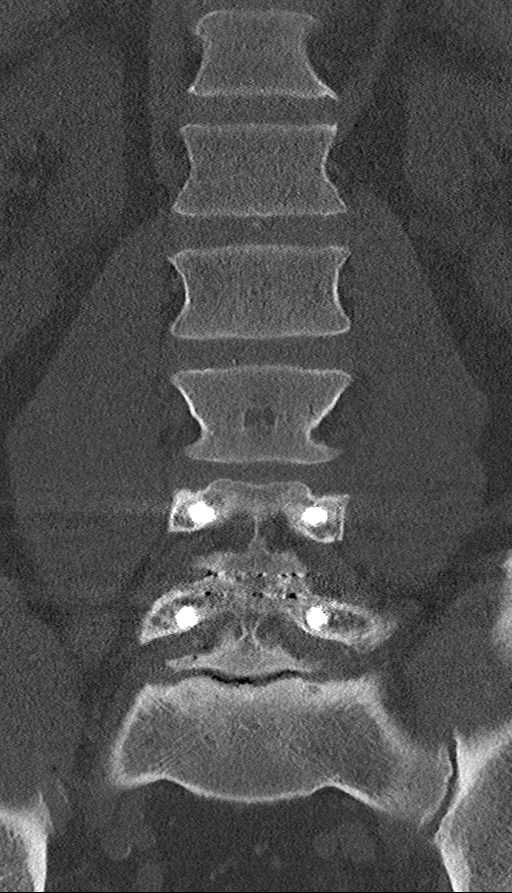
[im 48/80  bone]
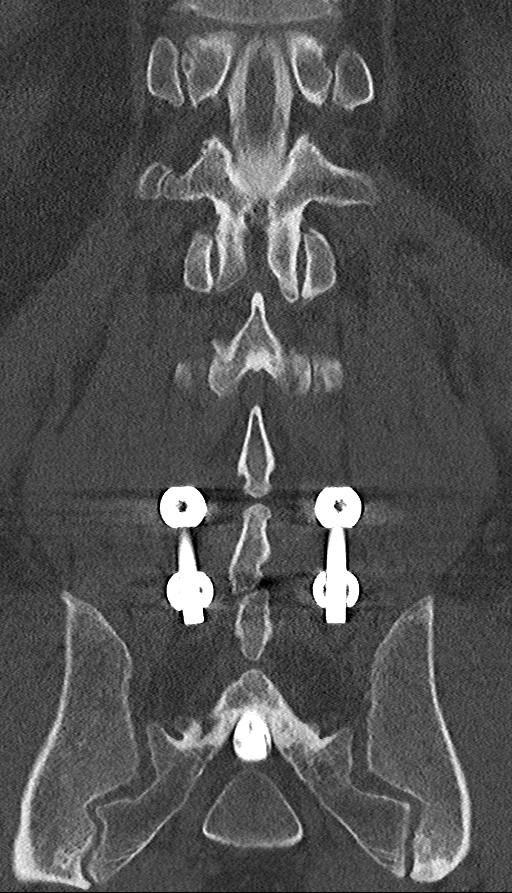

[Series 9: l-spine 2.0 sag bone · sagittal · 0.33mm/px · 5 of 75 slices shown, 6 images]
[im 25/75  bone]
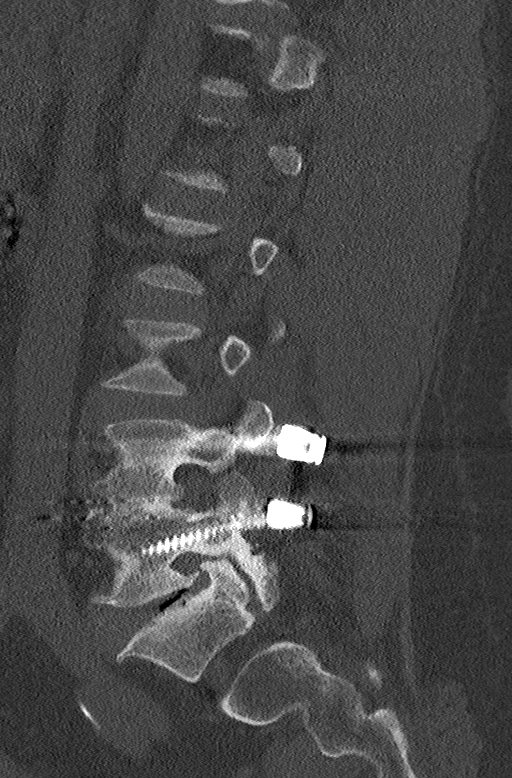
[im 31/75  bone]
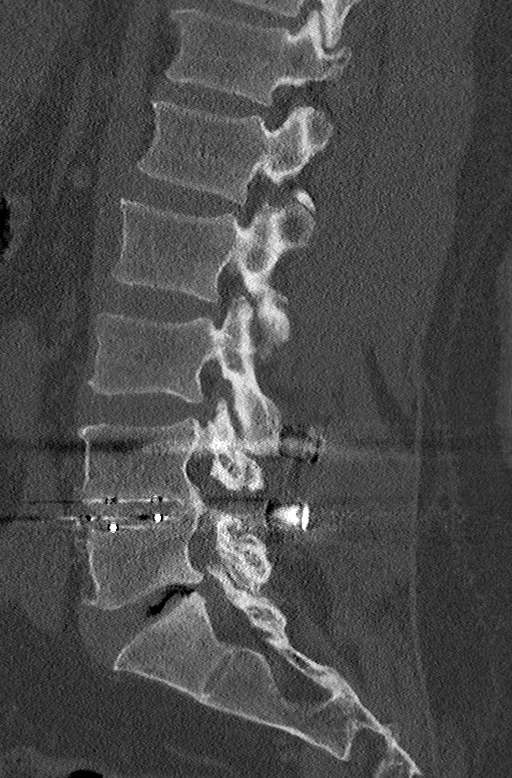
[im 38/75  soft-tissue]
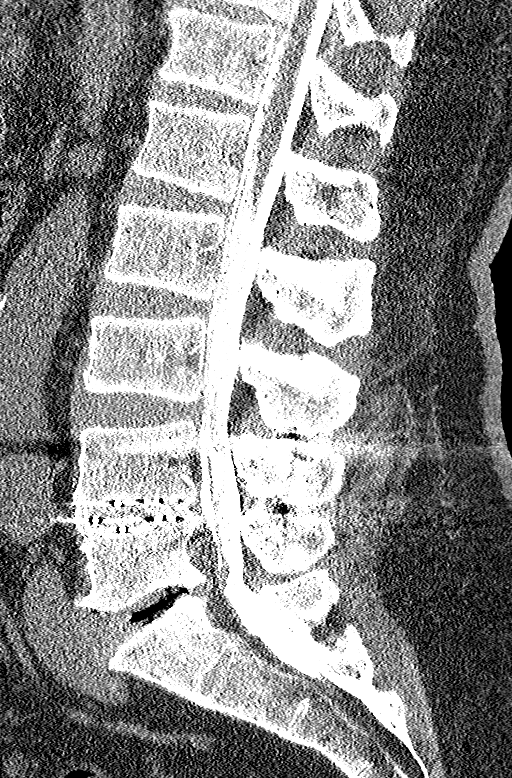
[im 38/75  bone]
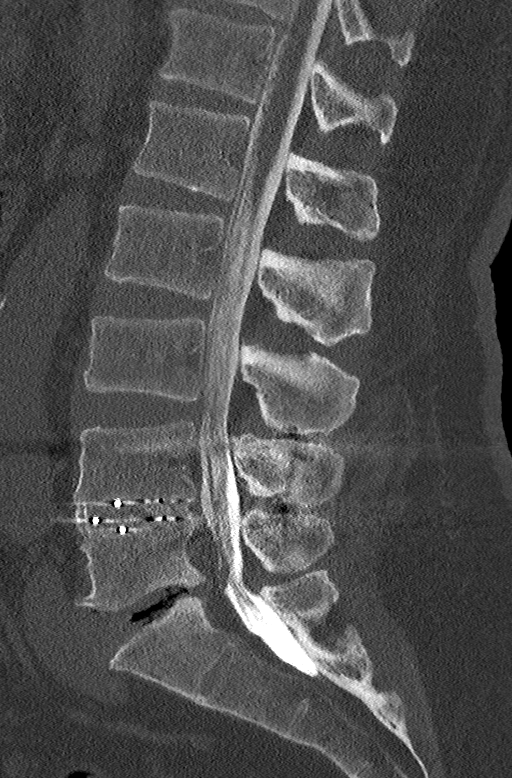
[im 44/75  bone]
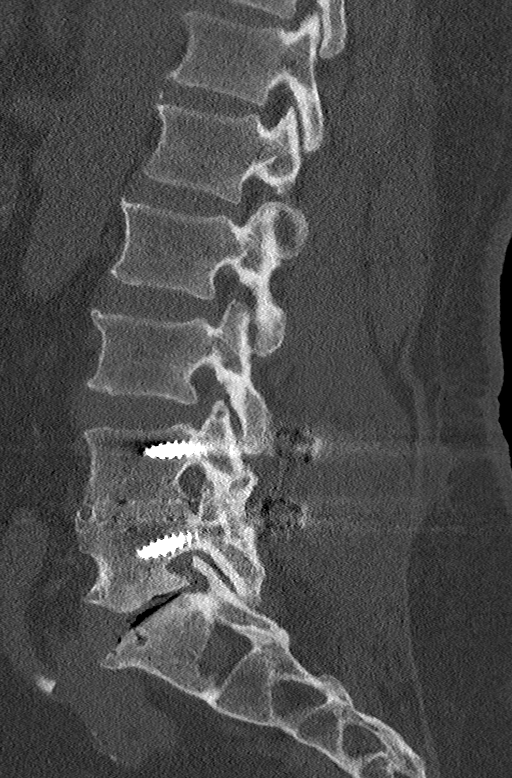
[im 50/75  bone]
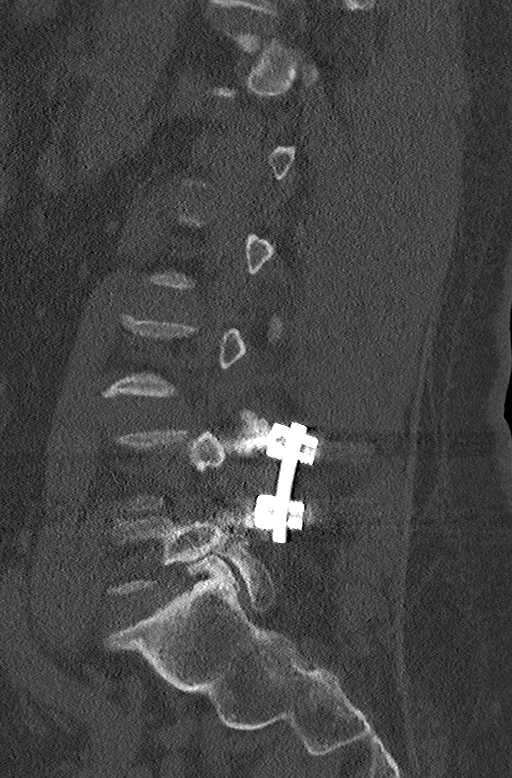

[11 of 33 positions shown; findings below may reference images not displayed]

FLUOROSCOPY TIME:  Reference procedure note

PROCEDURE:
CERVICAL AND LUMBAR MYELOGRAM

CT CERVICAL MYELOGRAM

CT LUMBAR MYELOGRAM

Lumbar puncture and intrathecal contrast administration were
performed by Dr. Rexgase who will separately report for the portion
of the procedure. I personally supervised acquisition of the
myelogram images.
FINDINGS: CERVICAL AND LUMBAR MYELOGRAM FINDINGS:

Solid PLIF at L4-5. Moderate disc degeneration at L5-S1. L3-4 disc
bulging and posterior element hypertrophy with mild spinal stenosis
which was slightly more prominent during extension.

No impediment to flow through the thoracic region.

Cervical imaging was very difficult due to broad shoulders and a
shoulder arthroplasty. Flexion and extension was not attempted as
this would be low yield when compared to the radiation dose.

CT CERVICAL MYELOGRAM FINDINGS:

Comparison is made with cervical MRI 08/25/2019

Alignment: Straight cervical spine.  No listhesis.

Vertebrae: C5-6 disc arthroplasty that is well seated. No evidence
of fracture, bone lesion, or facet erosion.

Extra-spinal: No explanation for symptoms.

Cord: Normal shape and volume. No abnormal intrathecal filling
defect.

Disc levels:

C2-3: Unremarkable.

C3-4: Minor disc narrowing with right sided endplate spurring. No
impingement

C4-5: Minor disc narrowing and tiny spurs.  No impingement

C5-6: Disc arthroplasty that is well seated.  No impingement

C6-7: Minimal ventral spondylitic change.  No impingement

C7-T1:Early facet spurring.  Negative disc space.  No impingement

CT LUMBAR MYELOGRAM FINDINGS:

Comparison is made with 07/13/2016 MRI

Segmentation: 5 lumbar type vertebrae based on the lowest ribs.

Alignment: Slight retrolisthesis at L5-S1, stable.

Vertebrae: L4-5 PLIF with solid arthrodesis. No evidence of fracture
or bone lesion.

Conus: Tip terminates at L1-2. No abnormal intrathecal filling
defect or nerve root distortion.

Extra-spinal: Mild atherosclerotic calcifications.

Disc levels:

L3-L4: Mild disc narrowing and bulging. Posterior ligamentous
thickening and mild epidural fat expansion. Mild spinal stenosis.
The foramina are patent.

L4-L5: PLIF with solid arthrodesis and no impingement.

L5-S1:Advanced disc degeneration with extensive gas containing disc
cleft, endplate ridging, and endplate sclerosis. The facets are
hypertrophic on both sides and there is biforaminal neural
impingement. Right paracentral disc protrusion which is detectable
due to gas in this location, presumably contacting the S1 nerve
root.
IMPRESSION: Cervical spine:

1. Well-positioned C5-6 disc arthroplasty.
2. No focal or notable degenerative changes. No impingement or cord
deformity to explain bilateral upper extremity symptoms.

Lumbar spine:

1. L5-S1 advanced disc and facet degeneration with biforaminal
impingement. There is a right paracentral protrusion which could
affect the descending S1 nerve root as well.
2. L4-5 PLIF with solid arthrodesis and no residual impingement.
3. L3-4 mild degenerative spinal stenosis.

## 2021-12-04 ENCOUNTER — Telehealth: Payer: Self-pay | Admitting: *Deleted

## 2021-12-04 NOTE — Telephone Encounter (Signed)
Faxed completed/signed surgical clearance form back to [facility] re: R knee arthroscopy under choice anesthesia, scheduled on 12/21/21. Pt cleared neurology/low risk per Dr. Epimenio Foot. Fax: [phone]. Received fax confirmation. ?

## 2022-01-29 ENCOUNTER — Telehealth: Payer: Self-pay | Admitting: Neurology

## 2022-01-29 MED ORDER — GABAPENTIN 600 MG PO TABS: 600.0000 mg | ORAL_TABLET | Freq: Three times a day (TID) | ORAL | 2 refills | Status: DC

## 2022-01-29 NOTE — Telephone Encounter (Signed)
Pt requesting refill for gabapentin (NEURONTIN) 600 MG tablet at  ?[facility]  ?[address] [place] [address] ?Phone: [phone] ?

## 2022-01-29 NOTE — Telephone Encounter (Signed)
Refill sent for the patient.  

## 2022-02-02 ENCOUNTER — Encounter: Payer: Self-pay | Admitting: Internal Medicine

## 2022-05-10 ENCOUNTER — Other Ambulatory Visit: Payer: Self-pay | Admitting: Neurology

## 2022-06-06 ENCOUNTER — Ambulatory Visit: Payer: BC Managed Care – PPO | Admitting: Neurology

## 2022-06-06 ENCOUNTER — Encounter: Payer: Self-pay | Admitting: Neurology

## 2022-06-06 VITALS — BP 147/88 | HR 54 | Ht 68.75 in | Wt 239.0 lb

## 2022-06-06 DIAGNOSIS — R200 Anesthesia of skin: Secondary | ICD-10-CM

## 2022-06-06 DIAGNOSIS — G5602 Carpal tunnel syndrome, left upper limb: Secondary | ICD-10-CM | POA: Diagnosis not present

## 2022-06-06 DIAGNOSIS — R208 Other disturbances of skin sensation: Secondary | ICD-10-CM | POA: Diagnosis not present

## 2022-06-06 MED ORDER — GABAPENTIN 600 MG PO TABS
600.0000 mg | ORAL_TABLET | Freq: Three times a day (TID) | ORAL | 12 refills | Status: AC
Start: 2022-06-06 — End: ?

## 2022-06-06 NOTE — Progress Notes (Signed)
[facility]  PATIENT: [name] DOB: 1956-03-30  REFERRING DOCTOR OR PCP: Barnett Abu, MD; Silvestre Moment, MD (PCP) SOURCE: Patient, notes from neurosurgery  _________________________________   HISTORICAL  CHIEF COMPLAINT:  Chief Complaint  Patient presents with   Follow-up    Pt alone, rm 2. Pt states here for medication management follow up.     HISTORY OF PRESENT ILLNESS:  [name], is a 66 y.o. man with left hand pain and numbness as well as foot numbness.  Update 06/06/2022: He feels the dysesthesias are doing about the same.    He had numbness and pain in the left hand and numbness in ince 2020   The numbness and pain in the left hand mostly in the palm and the 3rd and 4th fingers     Compared to last year, the left hand is slightly worse.  He notes more tingling at night and it sometimes awakens him.   He denies any weakness in the hand.   He also has numbness and mild dysesthesias (like sock rolled up under sole) in both feet.  He is on gabapentin 600 mg p.o. 3 times daily with benefit.  He tolerates it well.  HAND NUMBNESS He had numbness and pain in the left hand and numbness in both feet since 2020   The numbness and pain in the left hand is mostly in the palm and the first 4 fingers.    He notes more tingling at night and it sometimes awakens him.   He had a carpal tunnel surgery 03/2019 (Dr. Danielle Dess).     He continued to experience pain after the surgery that he felt was unchanged.  He had significant degenerative changes at C5-C6 and underwent decompression and arthroplasty at C5-C6 in late 2020.  Unfortunately, he continues to have numbness and stiffness in the left hand and was referred for NCV/EMG study.     Although there was no improvement in the pain, he had a repeat NCV/EMG which showed improvement of median nerve conduction.   STUDIES: Due to pain and stiffness in the left hand and a NCV/EMG study 02/05/2019 showing absent  median sensory responses and prolonged median motor distal latencies he was diagnosed with carpal tunnel syndrome.  The study was reviewed.  There did not appear to be superimposed radiculopathy.    CT myelogram of the cervical and lumbar spine on 02/10/2020: The C5-C6 disc arthroplasty is well-positioned.  There did not appear to be significant degenerative changes above or below the surgical level.  In the lumbar spine, there was disc protrusion, facet hypertrophy and ligamenta flava hypertrophy at L5-S1 causing bilateral foraminal narrowing and lateral recess stenosis.  There was potential to affect either of the S1 nerve roots.  It also showed L4-L5 cuff with solid arthrodesis.  There was borderline spinal stenosis at L3-L4.    REVIEW OF SYSTEMS: Constitutional: No fevers, chills, sweats, or change in appetite Eyes: No visual changes, double vision, eye pain Ear, nose and throat: No hearing loss, ear pain, nasal congestion, sore throat Cardiovascular: No chest pain, palpitations Respiratory:  No shortness of breath at rest or with exertion.   No wheezes GastrointestinaI: No nausea, vomiting, diarrhea, abdominal pain, fecal incontinence Genitourinary:  No dysuria, urinary retention or frequency.  No nocturia. Musculoskeletal: As above. Integumentary: No rash, pruritus, skin lesions Neurological: as above Psychiatric: No depression at this time.  No anxiety Endocrine: No palpitations, diaphoresis, change in appetite, change in weigh or increased thirst Hematologic/Lymphatic:  No anemia, purpura, petechiae. Allergic/Immunologic: No itchy/runny eyes, nasal congestion, recent allergic reactions, rashes  ALLERGIES: Allergies  Allergen Reactions   Penicillins     Rash  Has patient had a PCN reaction causing immediate rash, facial/tongue/throat swelling, SOB or lightheadedness with hypotension: Unknown Has patient had a PCN reaction causing severe rash involving mucus membranes or skin  necrosis: Unknown Has patient had a PCN reaction that required hospitalization No Has patient had a PCN reaction occurring within the last 10 years: No If all of the above answers are "NO", then may proceed with Cephalosporin use.     HOME MEDICATIONS:  Current Outpatient Medications:    aspirin 81 MG tablet, Take 81 mg by mouth daily., Disp: , Rfl:    atorvastatin (LIPITOR) 20 MG tablet, Take 20 mg by mouth daily., Disp: , Rfl: 3   CIALIS 5 MG tablet, Take 5 mg by mouth daily., Disp: , Rfl: 5   famotidine (PEPCID) 20 MG tablet, Take 20 mg by mouth daily.  , Disp: , Rfl:    Ibuprofen-diphenhydrAMINE HCl 200-25 MG CAPS, Take 2 tablets by mouth at bedtime as needed (sleep/pain). , Disp: , Rfl:    losartan (COZAAR) 25 MG tablet, Take 25 mg by mouth daily., Disp: , Rfl:    tamsulosin (FLOMAX) 0.4 MG CAPS capsule, Take 0.8 mg by mouth daily., Disp: , Rfl:    gabapentin (NEURONTIN) 600 MG tablet, Take 1 tablet (600 mg total) by mouth 3 (three) times daily., Disp: 90 tablet, Rfl: 12  PAST MEDICAL HISTORY: Past Medical History:  Diagnosis Date   Arthritis    BACK   Complication of anesthesia    GERD (gastroesophageal reflux disease)    Hypercholesterolemia    PONV (postoperative nausea and vomiting)    RMSF [facility] spotted fever)     PAST SURGICAL HISTORY: Past Surgical History:  Procedure Laterality Date   BACK SURGERY  2016   fusion- L4/L5   COLONOSCOPY  11/15/11   HERNIA REPAIR  1992   hiatal   right shoulder arthroscopic acrominoplasty  07/27/2008   w/acromioclavicular resection, subcapularis debridement   TOTAL SHOULDER ARTHROPLASTY  12/26/2010   right   VASECTOMY  1991, 2000   VASECTOMY REVERSAL  1998   WRIST SURGERY  1995   right    FAMILY HISTORY: Family History  Problem Relation Age of Onset   Coronary artery disease Mother    Lung cancer Mother    Crohn's disease Brother    Stroke Maternal Grandfather     SOCIAL HISTORY:  Social History    Socioeconomic History   Marital status: Single    Spouse name: Not on file   Number of children: 3   Years of education: Not on file   Highest education level: Not on file  Occupational History   Occupation: retired    Associate Professor: [employer]  Tobacco Use   Smoking status: Never   Smokeless tobacco: Former   Tobacco comments:    quit 15 years ago  Substance and Sexual Activity   Alcohol use: No    Comment: socially   Drug use: No   Sexual activity: Not on file  Other Topics Concern   Not on file  Social History Narrative   Lives with fiance   Caffeine use: coffee daily   Right handed    Retired Chartered loss adjuster.   Social Determinants of Health   Financial Resource Strain: Not on file  Food Insecurity: Not on file  Transportation Needs: Not  on file  Physical Activity: Not on file  Stress: Not on file  Social Connections: Not on file  Intimate Partner Violence: Not on file     PHYSICAL EXAM  Vitals:   06/06/22 0922  BP: (!) 147/88  Pulse: (!) 54  Weight: 239 lb (108.4 kg)  Height: 5' 8.75" (1.746 m)    Body mass index is 35.55 kg/m.   General: The patient is well-developed and well-nourished and in no acute distress  HEENT:  Head is [place]/AT.  Sclera are anicteric.   Neck: No carotid bruits are noted.  The neck has good range of motion  Skin: Extremities are without rash or  edema.   Neurologic Exam  Mental status: The patient is alert and oriented x 3 at the time of the examination. The patient has apparent normal recent and remote memory, with an apparently normal attention span and concentration ability.   Speech is normal.  Cranial nerves: Extraocular movements are full.  Facial strength and sensation was normal.  No obvious hearing deficits are noted.  Motor:  Muscle bulk is normal.   Tone is normal. Strength is  5 / 5 in all 4 extremities.   Sensory: He had slight reduced sensation in the left hand to pinprick over the thenar eminence.  He had mild  reduced sensation in the plantar aspect of both feet near the toes but normal sensation in the dorsal aspect of the foot.  Vibration sensation was normal in the hands and toes.  Coordination: Cerebellar testing reveals good finger-nose-finger and heel-to-shin bilaterally.  Gait and station: Station is normal.   Gait is normal. Tandem gait is normal. Romberg is negative.   Reflexes: Deep tendon reflexes are symmetric and normal bilaterally.        ASSESSMENT AND PLAN  Numbness  Left carpal tunnel syndrome  Dysesthesia   The symptoms in the left hand are likely sequela of his severe carpal tunnel syndrome.  Continue gabapentin 600 mg po tid -  should also help mild  foot dysesthesias.  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  It is fairly stable so we will hold off on further studies at this point.  However, if it worsens we will check an NCV/EMG of the leg.   RTC 1 year or as needed if new or worsening symptoms   If PCP can write gabapentin, then can f/u prn instead.    Dory Verdun A. Epimenio Foot, MD, [facility] 06/06/2022, 10:20 AM Certified in Neurology, Clinical Neurophysiology, Sleep Medicine and Neuroimaging  [facility] [address] [place], [place] [address] [phone]

## 2023-04-26 ENCOUNTER — Other Ambulatory Visit: Payer: Self-pay | Admitting: Family Medicine

## 2023-04-26 DIAGNOSIS — E785 Hyperlipidemia, unspecified: Secondary | ICD-10-CM

## 2023-05-07 ENCOUNTER — Ambulatory Visit (INDEPENDENT_AMBULATORY_CARE_PROVIDER_SITE_OTHER): Payer: BC Managed Care – PPO

## 2023-05-07 DIAGNOSIS — E785 Hyperlipidemia, unspecified: Secondary | ICD-10-CM

## 2023-06-08 ENCOUNTER — Other Ambulatory Visit: Payer: Self-pay

## 2023-06-08 ENCOUNTER — Emergency Department (HOSPITAL_COMMUNITY)
Admission: EM | Admit: 2023-06-08 | Discharge: 2023-06-09 | Disposition: A | Discharge status: Home / Self Care | Payer: BC Managed Care – PPO | Attending: Emergency Medicine | Admitting: Emergency Medicine

## 2023-06-08 ENCOUNTER — Encounter (HOSPITAL_COMMUNITY): Payer: Self-pay

## 2023-06-08 ENCOUNTER — Emergency Department (HOSPITAL_COMMUNITY): Payer: BC Managed Care – PPO

## 2023-06-08 DIAGNOSIS — Z79899 Other long term (current) drug therapy: Secondary | ICD-10-CM | POA: Insufficient documentation

## 2023-06-08 DIAGNOSIS — K2980 Duodenitis without bleeding: Secondary | ICD-10-CM | POA: Diagnosis not present

## 2023-06-08 DIAGNOSIS — K859 Acute pancreatitis without necrosis or infection, unspecified: Secondary | ICD-10-CM | POA: Insufficient documentation

## 2023-06-08 DIAGNOSIS — I10 Essential (primary) hypertension: Secondary | ICD-10-CM | POA: Diagnosis not present

## 2023-06-08 DIAGNOSIS — Z7982 Long term (current) use of aspirin: Secondary | ICD-10-CM | POA: Insufficient documentation

## 2023-06-08 DIAGNOSIS — R1013 Epigastric pain: Secondary | ICD-10-CM | POA: Diagnosis present

## 2023-06-08 LAB — CBC WITH DIFFERENTIAL/PLATELET
Abs Immature Granulocytes: 0.03 10*3/uL (ref 0.00–0.07)
Basophils Absolute: 0 10*3/uL (ref 0.0–0.1)
Basophils Relative: 0 %
Eosinophils Absolute: 0.1 10*3/uL (ref 0.0–0.5)
Eosinophils Relative: 1 %
HCT: 41.8 % (ref 39.0–52.0)
Hemoglobin: 14.7 g/dL (ref 13.0–17.0)
Immature Granulocytes: 0 %
Lymphocytes Relative: 11 %
Lymphs Abs: 1.5 10*3/uL (ref 0.7–4.0)
MCH: 31.8 pg (ref 26.0–34.0)
MCHC: 35.2 g/dL (ref 30.0–36.0)
MCV: 90.5 fL (ref 80.0–100.0)
Monocytes Absolute: 1.3 10*3/uL — ABNORMAL HIGH (ref 0.1–1.0)
Monocytes Relative: 10 %
Neutro Abs: 10.3 10*3/uL — ABNORMAL HIGH (ref 1.7–7.7)
Neutrophils Relative %: 78 %
Platelets: 205 10*3/uL (ref 150–400)
RBC: 4.62 MIL/uL (ref 4.22–5.81)
RDW: 12.4 % (ref 11.5–15.5)
WBC: 13.3 10*3/uL — ABNORMAL HIGH (ref 4.0–10.5)
nRBC: 0 % (ref 0.0–0.2)

## 2023-06-08 LAB — COMPREHENSIVE METABOLIC PANEL
ALT: 27 U/L (ref 0–44)
AST: 21 U/L (ref 15–41)
Albumin: 4.4 g/dL (ref 3.5–5.0)
Alkaline Phosphatase: 61 U/L (ref 38–126)
Anion gap: 10 (ref 5–15)
BUN: 14 mg/dL (ref 8–23)
CO2: 24 mmol/L (ref 22–32)
Calcium: 9.6 mg/dL (ref 8.9–10.3)
Chloride: 103 mmol/L (ref 98–111)
Creatinine, Ser: 0.9 mg/dL (ref 0.61–1.24)
GFR, Estimated: >60 mL/min (ref >60)
Glucose, Bld: 121 mg/dL — ABNORMAL HIGH (ref 70–99)
Potassium: 3.9 mmol/L (ref 3.5–5.1)
Sodium: 137 mmol/L (ref 135–145)
Total Bilirubin: 0.8 mg/dL (ref 0.3–1.2)
Total Protein: 7.7 g/dL (ref 6.5–8.1)

## 2023-06-08 LAB — TROPONIN I (HIGH SENSITIVITY): Troponin I (High Sensitivity): 20 ng/L — ABNORMAL HIGH (ref <18)

## 2023-06-08 LAB — URINALYSIS, ROUTINE W REFLEX MICROSCOPIC
Bacteria, UA: NONE SEEN
Bilirubin Urine: NEGATIVE
Glucose, UA: NEGATIVE mg/dL
Ketones, ur: 5 mg/dL — AB
Leukocytes,Ua: NEGATIVE
Nitrite: NEGATIVE
Protein, ur: NEGATIVE mg/dL
Specific Gravity, Urine: 1.018 (ref 1.005–1.030)
pH: 5 (ref 5.0–8.0)

## 2023-06-08 LAB — LIPASE, BLOOD: Lipase: 162 U/L — ABNORMAL HIGH (ref 11–51)

## 2023-06-08 MED ORDER — ACETAMINOPHEN 325 MG PO TABS
650.0000 mg | ORAL_TABLET | Freq: Once | ORAL | Status: AC
  Administered 2023-06-08: 650 mg via ORAL
  Filled 2023-06-08: qty 2

## 2023-06-08 MED ORDER — PANTOPRAZOLE SODIUM 40 MG IV SOLR
40.0000 mg | Freq: Once | INTRAVENOUS | Status: AC
  Administered 2023-06-08: 40 mg via INTRAVENOUS
  Filled 2023-06-08: qty 10

## 2023-06-08 MED ORDER — IOHEXOL 300 MG/ML  SOLN
100.0000 mL | Freq: Once | INTRAMUSCULAR | Status: AC | PRN
  Administered 2023-06-08: 100 mL via INTRAVENOUS

## 2023-06-08 NOTE — ED Notes (Signed)
Pt able to ambulate to restroom. Urine sample collected.

## 2023-06-08 NOTE — ED Provider Notes (Signed)
[place] [facility]ERGENCY DEPARTMENT AT [facility] Provider Note   CSN: [id number] Arrival date & time: 06/08/23  2021  History  Chief Complaint  Patient presents with   Abdominal Pain    [name] is a 67 y.o. male with past medical history of hypertension, HLD, neuropathy, GERD, BPH presents to ED for abdominal pain.  The history is provided by the patient. No language interpreter was used.  Abdominal Pain  Reports abdominal pain that started three days ago localized to epigastric region radiates to bilateral back, describes it as muscles cramps that comes and goes, nothing alleviates this pain. Scales it as 8/10 pain. Associates symptoms of headaches, weakness, fever, and chills. Reports shortness of breath with pain but otherwise none. Patient reports 1 episode of NBNB emesis. Patient reports watery stools, no hematochezia or melena. Last bowel movement was today at 7 PM, described it as watery. No association with food. Patient has tried taking Advil with minimum relief. Denies any regular NSAIDs use. Patient reports decrease PO intake. Of note, patient reports epigastric pain that radiates to bilateral back with associated symptoms of diaphoresis and SOB. Patient denies blurring of his vision, cough, congestion, sore throat, sick contacts, chest pain, nausea or vomiting, dysuria, numbness or tingling. Pt denies smoking cigarettes, no alcohol, and illicit drugs.   Home Medications Prior to Admission medications   Medication Sig Start Date End Date Taking? Authorizing Provider  aspirin 81 MG tablet Take 81 mg by mouth daily.    [provider]  atorvastatin (LIPITOR) 20 MG tablet Take 20 mg by mouth daily. 10/26/15   [provider]  CIALIS 5 MG tablet Take 5 mg by mouth daily. 11/21/15   [provider]  famotidine (PEPCID) 20 MG tablet Take 20 mg by mouth daily.      [provider]  gabapentin (NEURONTIN) 600 MG tablet Take 1 tablet  (600 mg total) by mouth 3 (three) times daily. 06/06/22   Sater, Pearletha Furl, MD  Ibuprofen-diphenhydrAMINE HCl 200-25 MG CAPS Take 2 tablets by mouth at bedtime as needed (sleep/pain).     [provider]  losartan (COZAAR) 25 MG tablet Take 25 mg by mouth daily. 03/23/22   [provider]  tamsulosin (FLOMAX) 0.4 MG CAPS capsule Take 0.8 mg by mouth daily. 03/23/22   [provider]      Allergies    Penicillins    Review of Systems   Review of Systems  Gastrointestinal:  Positive for abdominal pain.  As per HPI  Physical Exam Updated Vital Signs BP (!) 163/88 (BP Location: Left Arm)   Pulse 63   Temp 98.9 F (37.2 C) (Oral)   Resp 18   Ht 5\' 8"  (1.727 m)   Wt 110.7 kg   SpO2 95%   BMI 37.10 kg/m  Physical Exam  General: Well-developed, well-nourished, no acute distress, laying comfortably in bed HENT: Atraumatic, normocephalic, mucous membranes are moist Cardiovascular: Regular rate, regular rhythm, no murmurs, rubs or gallops.  No lower extremity edema bilaterally.  2+ DP pulses intact bilaterally. Pulmonary: Lungs are clear to auscultation bilaterally, no wheezing or rales noted Abdomen: Tenderness to palpation of the epigastric and right upper quadrant, otherwise soft and nondistended.  No guarding.  Bowel sounds are present. MSK: ROM intact  ED Results / Procedures / Treatments   Labs (all labs ordered are listed, but only abnormal results are displayed) Labs Reviewed  LIPASE, BLOOD - Abnormal; Notable for the following components:  Result Value   Lipase 162 (*)    All other components within normal limits  COMPREHENSIVE METABOLIC PANEL - Abnormal; Notable for the following components:   Glucose, Bld 121 (*)    All other components within normal limits  URINALYSIS, ROUTINE W REFLEX MICROSCOPIC - Abnormal; Notable for the following components:   Hgb urine dipstick SMALL (*)    Ketones, ur 5 (*)    All other components within normal  limits  CBC WITH DIFFERENTIAL/PLATELET - Abnormal; Notable for the following components:   WBC 13.3 (*)    Neutro Abs 10.3 (*)    Monocytes Absolute 1.3 (*)    All other components within normal limits  TROPONIN I (HIGH SENSITIVITY) - Abnormal; Notable for the following components:   Troponin I (High Sensitivity) 20 (*)    All other components within normal limits    EKG EKG Interpretation Date/Time:  Saturday June 08 2023 22:44:17 EDT Ventricular Rate:  58 PR Interval:  156 QRS Duration:  89 QT Interval:  399 QTC Calculation: 392 R Axis:   50  Text Interpretation: Sinus rhythm Low voltage, extremity and precordial leads Confirmed by Lorre Nick ([id number]) on 06/08/2023 10:56:19 PM  Radiology No results found.  Procedures Procedures  None   Medications Ordered in ED Medications  pantoprazole (PROTONIX) injection 40 mg (has no administration in time range)  acetaminophen (TYLENOL) tablet 650 mg (650 mg Oral Given 06/08/23 2143)  iohexol (OMNIPAQUE) 300 MG/ML solution 100 mL (100 mLs Intravenous Contrast Given 06/08/23 2217)    ED Course/ Medical Decision Making/ A&P                              Medical Decision Making Amount and/or Complexity of Data Reviewed Labs: ordered. Radiology: ordered.  Risk OTC drugs. Prescription drug management.   This patient presents to the ED for concern of 3 days of epigastric pain, this involves an extensive number of treatment options, and is a complaint that carries with it a high risk of complications and morbidity.  The differential diagnosis includes ACS, gastritis, pancreatitis, cholecystitis, biliary colic, intestinal obstruction, mesenteric ischemia, hepatitis, perforated viscus.  Co morbidities that complicate the patient evaluation  Hypertension, hyperlipidemia, GERD  Additional history obtained:  Additional history obtained from none External records from outside source obtained and reviewed including none  Lab  Tests:  I Ordered, and personally interpreted labs. The pertinent results include:  Hgb normal, PLT normal. WBC elevated to 13.3 with Abs Neut 10.3, electrolytes normal, BUN normal, creatinine normal, liver enzymes normal, ALP normal, lipase 162.  UA negative for nitrates and leukocytes. Troponin 20.   Imaging Studies ordered:  I ordered imaging studies including CT abdomen pelvis with contrast I independently visualized and interpreted imaging which did not show an acute process, however will wait for the radiologist interpretation as it is pending.    Cardiac Monitoring: / EKG:  The patient was maintained on a cardiac monitor. I personally viewed and interpreted the cardiac monitored which showed an underlying rhythm of: Sinus rate and rhythm. Normal PR interval. No QRS prolongation. No ST elevation or depression noted.   Consultations Obtained:  No consultation is requested at this time.    Problem List / ED Course / Critical interventions / Medication management  Epigastric pain. Currently working up ACS vs pancreatitis vs viral gastroenteritis  I ordered medication including Acetaminophen 650 mg  Reevaluation of the patient after these medicines showed that  the patient stayed the same I have reviewed the patients home medicines and have made adjustments as needed   Social Determinants of Health:  Lives with his wife.  Test / Admission - Considered:  This is a 67 year old male with past medical history of hypertension, hyperlipidemia, neuropathy, GERD, BPH presents to ED for 3 days of abdominal pain localized to epigastric region radiating to bilateral flanks with associated symptoms of headaches, weakness, fever, chills, watery stools, 1 episode of vomiting yesterday. He also notes epigastric pain with diaphoresis and SOB that waxes and wanes. Patient denies any sick contacts. Per PE, patient endorses tenderness at the epigastric and right upper quadrant. Lipase 162, elevated 3x  UNL and mild leukocytosis WBC 13.3. Otherwise electrolytes normal. Trop elevated to 20, second troponin pending. CT abd pelvis is pending for radiologist interpretation. Currently working up ACS vs pancreatitis vs viral GI. Patient is handed off to the next ED physician.   Final Clinical Impression(s) / ED Diagnoses Final diagnoses:  None    Rx / DC Orders ED Discharge Orders     None         Jeral Pinch, DO 06/08/23 2305    Lorre Nick, MD 06/09/23 1706

## 2023-06-08 NOTE — ED Triage Notes (Signed)
Upper abdominal pain that wraps around to the back, subjective fevers and weakness x 3 days.

## 2023-06-08 NOTE — ED Provider Notes (Signed)
I saw and evaluated the patient, reviewed the resident's note and I agree with the findings and plan.      67 year old male presents with intermittent abdominal discomfort in his epigastric region x 3 days.  States it waxes and wanes and can last for minutes to several hours.  No exertional component to it.  No associate emesis.   Lorre Nick, MD 06/11/23 [phone]

## 2023-06-08 NOTE — ED Notes (Signed)
#   second Trop was sent

## 2023-06-09 LAB — TROPONIN I (HIGH SENSITIVITY): Troponin I (High Sensitivity): 20 ng/L — ABNORMAL HIGH (ref <18)

## 2023-06-09 MED ORDER — LIDOCAINE VISCOUS HCL 2 % MT SOLN
15.0000 mL | Freq: Once | OROMUCOSAL | Status: AC
  Administered 2023-06-09: 15 mL via ORAL
  Filled 2023-06-09: qty 15

## 2023-06-09 MED ORDER — ALUM & MAG HYDROXIDE-SIMETH 200-200-20 MG/5ML PO SUSP
30.0000 mL | Freq: Once | ORAL | Status: AC
  Administered 2023-06-09: 30 mL via ORAL
  Filled 2023-06-09: qty 30

## 2023-06-09 MED ORDER — ONDANSETRON HCL 4 MG/2ML IJ SOLN
4.0000 mg | Freq: Once | INTRAMUSCULAR | Status: AC
  Administered 2023-06-09: 4 mg via INTRAVENOUS
  Filled 2023-06-09: qty 2

## 2023-06-09 MED ORDER — PANTOPRAZOLE SODIUM 20 MG PO TBEC
20.0000 mg | DELAYED_RELEASE_TABLET | Freq: Every day | ORAL | 1 refills | Status: AC
Start: 2023-06-09 — End: ?

## 2023-06-09 MED ORDER — SUCRALFATE 1 G PO TABS: 1.0000 g | ORAL_TABLET | Freq: Three times a day (TID) | ORAL | 0 refills | Status: AC

## 2023-06-09 MED ORDER — HYDROMORPHONE HCL 1 MG/ML IJ SOLN
1.0000 mg | Freq: Once | INTRAMUSCULAR | Status: AC
  Administered 2023-06-09: 1 mg via INTRAVENOUS
  Filled 2023-06-09: qty 1

## 2023-06-09 MED ORDER — OXYCODONE-ACETAMINOPHEN 5-325 MG PO TABS: 1.0000 | ORAL_TABLET | Freq: Four times a day (QID) | ORAL | 0 refills | Status: AC | PRN

## 2023-06-09 NOTE — ED Provider Notes (Signed)
1:25 AM Patient care assumed from MD Tawkaliyar at shift change.  In short, patient presenting for 3 days of epigastric pain which radiates to the back.  He has had associated nausea with 1 episode of vomiting.  Upon reassessment of the patient, he states that pain has worsened.  It has been present for approximately 1 hour.  Previously given Protonix.  Will add Dilaudid, Zofran, GI cocktail.  Discussed results of labs and imaging with patient and spouse.  He has noted to have a leukocytosis of 13.3.  This is favored to reflect stress of ongoing pain inflammatory changes seen on imaging.  CT notes peripancreatic inflammatory stranding at the head of the pancreas involving the pancreaticoduodenal groove. There is circumferential wall thickening to the second portion of the duodenum consistent with infectious or inflammatory duodenitis.  His lipase is elevated at 164, consistent with diagnosis of pancreatitis.  Gallbladder is unremarkable on CT.  He has no LFT changes.  Reports to be a social, minimal drinker.  A troponin level was obtained to assess for atypically presenting ACS.  Patient was found to have a minimally elevated troponin at 20.  This has remained flat on repeat assessment.  He has no acute ischemic changes on EKG.  I have a low suspicion for cardiac related pathology in this patient.  Given persistent nature of symptoms, have offered admission for pain control.  Patient states that he would rather go home with prescriptions and supportive care instructions.  Plan to reassess after administration of additional medications.  2:36 AM Patient feeling better after Dilaudid, Zofran, GI cocktail. Requesting outpatient management and PCP f/u. Patient is reliable for this; feel this is a reasonable plan. Patient encouraged to return if symptoms persist or worsen. Patient and spouse agreeable.   Results for orders placed or performed during the hospital encounter of 06/08/23  Lipase, blood   Result Value Ref Range   Lipase 162 (H) 11 - 51 U/L  Comprehensive metabolic panel  Result Value Ref Range   Sodium 137 135 - 145 mmol/L   Potassium 3.9 3.5 - 5.1 mmol/L   Chloride 103 98 - 111 mmol/L   CO2 24 22 - 32 mmol/L   Glucose, Bld 121 (H) 70 - 99 mg/dL   BUN 14 8 - 23 mg/dL   Creatinine, Ser 7.82 0.61 - 1.24 mg/dL   Calcium 9.6 8.9 - 95.6 mg/dL   Total Protein 7.7 6.5 - 8.1 g/dL   Albumin 4.4 3.5 - 5.0 g/dL   AST 21 15 - 41 U/L   ALT 27 0 - 44 U/L   Alkaline Phosphatase 61 38 - 126 U/L   Total Bilirubin 0.8 0.3 - 1.2 mg/dL   GFR, Estimated >21 >30 mL/min   Anion gap 10 5 - 15  Urinalysis, Routine w reflex microscopic -Urine, Clean Catch  Result Value Ref Range   Color, Urine YELLOW YELLOW   APPearance CLEAR CLEAR   Specific Gravity, Urine 1.018 1.005 - 1.030   pH 5.0 5.0 - 8.0   Glucose, UA NEGATIVE NEGATIVE mg/dL   Hgb urine dipstick SMALL (A) NEGATIVE   Bilirubin Urine NEGATIVE NEGATIVE   Ketones, ur 5 (A) NEGATIVE mg/dL   Protein, ur NEGATIVE NEGATIVE mg/dL   Nitrite NEGATIVE NEGATIVE   Leukocytes,Ua NEGATIVE NEGATIVE   RBC / HPF 0-5 0 - 5 RBC/hpf   WBC, UA 0-5 0 - 5 WBC/hpf   Bacteria, UA NONE SEEN NONE SEEN   Squamous Epithelial / HPF 0-5 0 -  5 /HPF   Mucus PRESENT   CBC with Differential  Result Value Ref Range   WBC 13.3 (H) 4.0 - 10.5 K/uL   RBC 4.62 4.22 - 5.81 MIL/uL   Hemoglobin 14.7 13.0 - 17.0 g/dL   HCT 18.2 99.3 - 71.6 %   MCV 90.5 80.0 - 100.0 fL   MCH 31.8 26.0 - 34.0 pg   MCHC 35.2 30.0 - 36.0 g/dL   RDW 96.7 89.3 - 81.0 %   Platelets 205 150 - 400 K/uL   nRBC 0.0 0.0 - 0.2 %   Neutrophils Relative % 78 %   Neutro Abs 10.3 (H) 1.7 - 7.7 K/uL   Lymphocytes Relative 11 %   Lymphs Abs 1.5 0.7 - 4.0 K/uL   Monocytes Relative 10 %   Monocytes Absolute 1.3 (H) 0.1 - 1.0 K/uL   Eosinophils Relative 1 %   Eosinophils Absolute 0.1 0.0 - 0.5 K/uL   Basophils Relative 0 %   Basophils Absolute 0.0 0.0 - 0.1 K/uL   Immature Granulocytes 0 %    Abs Immature Granulocytes 0.03 0.00 - 0.07 K/uL  Troponin I (High Sensitivity)  Result Value Ref Range   Troponin I (High Sensitivity) 20 (H) <18 ng/L  Troponin I (High Sensitivity)  Result Value Ref Range   Troponin I (High Sensitivity) 20 (H) <18 ng/L   CT ABDOMEN PELVIS W CONTRAST  Result Date: 06/08/2023 CLINICAL DATA:  Acute upper abdominal pain, fever, generalized weakness EXAM: CT ABDOMEN AND PELVIS WITH CONTRAST TECHNIQUE: Multidetector CT imaging of the abdomen and pelvis was performed using the standard protocol following bolus administration of intravenous contrast. RADIATION DOSE REDUCTION: This exam was performed according to the departmental dose-optimization program which includes automated exposure control, adjustment of the mA and/or kV according to patient size and/or use of iterative reconstruction technique. CONTRAST:  OMNIPAQUE IOHEXOL 300 MG/ML  SOLN COMPARISON:  None FINDINGS: Lower chest: No acute abnormality.  Small hiatal hernia Hepatobiliary: Mild hepatic steatosis. No enhancing intrahepatic mass. No intra or extrahepatic biliary ductal dilation. Gallbladder unremarkable. Pancreas: There is mild peripancreatic inflammatory stranding surrounding the head of the pancreas, involving the pancreatico duodenal groove, and involving the second portion of the duodenum which demonstrates mild circumferential wall thickening. Findings may relate to an acute infectious or inflammatory duodenitis or acute interstitial/edematous pancreatitis. There is normal enhancement of the pancreatic parenchyma. The pancreatic duct is not dilated. No loculated peripancreatic fluid collections are seen. Spleen: Normal in size without focal abnormality. Adrenals/Urinary Tract: Adrenal glands are unremarkable. Kidneys are normal, without renal calculi, focal lesion, or hydronephrosis. Bladder is unremarkable. Stomach/Bowel: Mild sigmoid diverticulosis. As noted above, the stomach, small bowel, and  large bowel are unremarkable. Appendix normal. No free intraperitoneal gas or fluid. Vascular/Lymphatic: Mild aortoiliac atherosclerotic calcification. No aortic aneurysm. The abdominal vasculature is otherwise unremarkable. Specifically, the portal vein, superior mesenteric vein, and splenic vein are patent. No pathologic adenopathy within the abdomen and pelvis. Reproductive: Prostate is unremarkable. Other: Small fat containing direct right inguinal hernia. 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 Degenerative changes noted within the visualized thoracolumbar spine. No acute bone abnormality. No lytic or blastic bone lesion. IMPRESSION: 1. Mild peripancreatic inflammatory stranding surrounding the head of the pancreas, involving the pancreatico duodenal groove, and involving the second portion of the duodenum which demonstrates mild circumferential wall thickening. Findings may relate to an acute infectious or inflammatory duodenitis or acute interstitial/edematous pancreatitis. Correlation with serum amylase and lipase  would be helpful for confirmation. 2. Mild hepatic steatosis. 3. Mild sigmoid diverticulosis. 4. Small hiatal hernia. 5. Small fat containing direct right inguinal hernia. Aortic Atherosclerosis (ICD10-I70.0). Electronically Signed   By: Helyn Numbers M.D.   On: 06/08/2023 23:11      Antony Madura, PA-C 06/09/23 [id number]    Shon Baton, MD 06/09/23 [phone]

## 2023-06-09 NOTE — Discharge Instructions (Addendum)
Your workup today was notable for inflammation of your pancreas as well as the beginning portion of your small intestine.  This does correlate with the area of your pain.  Take Protonix and Carafate as prescribed.  You have been prescribed a short course of Percocet for pain control as well.  Do not drive or drink alcohol after taking this medication as it may make you drowsy and impair your judgment.  Try to avoid use of anti-inflammatories including ibuprofen and Aleve.  We recommend a clear liquid diet until symptoms are improving after which time you can slowly advance your diet back to normal.  Continue to remain well-hydrated by drinking plenty of fluids.  You would benefit from close follow-up with your primary care doctor, ideally in 2 or 3 days.    Your troponin enzyme (heart enzyme) was minimally elevated. This can be trended by your primary care doctor as well. We do not believe this change correlates with your pain today. Your EKG while in the ED was reassuring.   Return for new or concerning symptoms.

## 2023-06-11 ENCOUNTER — Ambulatory Visit: Payer: BC Managed Care – PPO | Admitting: Neurology

## 2023-10-22 ENCOUNTER — Telehealth: Payer: Self-pay | Admitting: Neurology

## 2023-10-22 NOTE — Telephone Encounter (Signed)
Pt cx appt. States it is not needed

## 2023-10-29 ENCOUNTER — Ambulatory Visit: Payer: Medicare PPO | Admitting: Neurology

## 2023-11-10 ENCOUNTER — Ambulatory Visit
Admission: RE | Admit: 2023-11-10 | Discharge: 2023-11-10 | Disposition: A | Discharge status: Home / Self Care | Payer: Medicare PPO | Source: Ambulatory Visit | Attending: Family Medicine | Admitting: Family Medicine

## 2023-11-10 ENCOUNTER — Other Ambulatory Visit: Payer: Self-pay

## 2023-11-10 VITALS — BP 155/83 | HR 85 | Temp 101.3°F | Resp 17 | Ht 68.0 in | Wt 232.0 lb

## 2023-11-10 DIAGNOSIS — R6889 Other general symptoms and signs: Secondary | ICD-10-CM | POA: Diagnosis not present

## 2023-11-10 DIAGNOSIS — R059 Cough, unspecified: Secondary | ICD-10-CM | POA: Diagnosis not present

## 2023-11-10 DIAGNOSIS — R509 Fever, unspecified: Secondary | ICD-10-CM | POA: Diagnosis not present

## 2023-11-10 LAB — RESP PANEL BY RT-PCR (RSV, FLU A&B, COVID)  RVPGX2
Influenza A by PCR: POSITIVE — AB
Influenza B by PCR: NEGATIVE
Resp Syncytial Virus by PCR: NEGATIVE
SARS Coronavirus 2 by RT PCR: NEGATIVE

## 2023-11-10 MED ORDER — PREDNISONE 20 MG PO TABS: ORAL_TABLET | ORAL | 0 refills | Status: DC

## 2023-11-10 MED ORDER — OSELTAMIVIR PHOSPHATE 75 MG PO CAPS: 75.0000 mg | ORAL_CAPSULE | Freq: Two times a day (BID) | ORAL | 0 refills | Status: AC

## 2023-11-10 MED ORDER — BENZONATATE 200 MG PO CAPS: 200.0000 mg | ORAL_CAPSULE | Freq: Three times a day (TID) | ORAL | 0 refills | Status: AC | PRN

## 2023-11-10 MED ORDER — PROMETHAZINE-DM 6.25-15 MG/5ML PO SYRP: 5.0000 mL | ORAL_SOLUTION | Freq: Two times a day (BID) | ORAL | 0 refills | Status: AC | PRN

## 2023-11-10 MED ORDER — ACETAMINOPHEN 500 MG PO TABS
1000.0000 mg | ORAL_TABLET | ORAL | Status: AC
  Administered 2023-11-10: 1000 mg via ORAL

## 2023-11-10 NOTE — ED Triage Notes (Signed)
 Pt states that he has a fever, coughing, headache, body aches and fatigue.  X2 days

## 2023-11-10 NOTE — ED Provider Notes (Signed)
 [name] C[facility]    CSN: [id number] Arrival date & time: 11/10/23  0846      History   Chief Complaint Chief Complaint  Patient presents with   Fever    Cough - Entered by patient    HPI [name] is a 68 y.o. male.   HPI pleasant 68 year old male presents with influenza-like symptoms (fever, coughing, headache, body aches and fatigue).  PMH significant for obesity, GERD, and hypercholesterolemia.  Past Medical History:  Diagnosis Date   Arthritis    BACK   Complication of anesthesia    GERD (gastroesophageal reflux disease)    Hypercholesterolemia    PONV (postoperative nausea and vomiting)    RMSF [facility] spotted fever)     Patient Active Problem List   Diagnosis Date Noted   Numbness 05/04/2020   Left carpal tunnel syndrome 05/04/2020   History of lumbar fusion 05/04/2020   History of cervical spinal surgery 05/04/2020   History of carpal tunnel surgery of left wrist 05/04/2020   Adjustment disorder with depressed mood 12/11/2015   Herniated nucleus pulposus, L4-5 left 03/21/2015   Dyslipidemia 10/05/2011    Past Surgical History:  Procedure Laterality Date   BACK SURGERY  2016   fusion- L4/L5   COLONOSCOPY  11/15/11   HERNIA REPAIR  1992   hiatal   right shoulder arthroscopic acrominoplasty  07/27/2008   w/acromioclavicular resection, subcapularis debridement   TOTAL SHOULDER ARTHROPLASTY  12/26/2010   right   VASECTOMY  1991, 2000   VASECTOMY REVERSAL  1998   WRIST SURGERY  1995   right       Home Medications    Prior to Admission medications   Medication Sig Start Date End Date Taking? Authorizing Provider  aspirin  81 MG tablet Take 81 mg by mouth daily.   Yes [provider]  benzonatate  (TESSALON ) 200 MG capsule Take 1 capsule (200 mg total) by mouth 3 (three) times daily as needed for up to 7 days. 11/10/23 11/17/23 Yes Teddy Ozell, FNP  losartan (COZAAR) 25 MG tablet Take 25 mg by mouth daily. 03/23/22  Yes  [provider]  oseltamivir  (TAMIFLU ) 75 MG capsule Take 1 capsule (75 mg total) by mouth every 12 (twelve) hours. 11/10/23  Yes Teddy Ozell, FNP  oxyCODONE -acetaminophen  (PERCOCET/ROXICET) 5-325 MG tablet Take 1 tablet by mouth every 6 (six) hours as needed for severe pain. 06/09/23  Yes Keith Sor, PA-C  pantoprazole  (PROTONIX ) 20 MG tablet Take 1 tablet (20 mg total) by mouth daily. 06/09/23  Yes Keith Sor, PA-C  predniSONE  (DELTASONE ) 20 MG tablet Take 3 tabs PO daily x 5 days. 11/10/23  Yes Teddy Ozell, FNP  promethazine -dextromethorphan (PROMETHAZINE -DM) 6.25-15 MG/5ML syrup Take 5 mLs by mouth 2 (two) times daily as needed for cough. 11/10/23  Yes Meosha Castanon, FNP  REPATHA 140 MG/ML SOSY Inject 1 mL into the skin every 14 (fourteen) days. 05/20/23  Yes [provider]  simvastatin (ZOCOR) 20 MG tablet Take 20 mg by mouth every evening. 05/09/23  Yes [provider]  tadalafil (CIALIS) 10 MG tablet Take 10 mg by mouth as needed.   Yes [provider]  tamsulosin  (FLOMAX ) 0.4 MG CAPS capsule Take 0.8 mg by mouth daily. 03/23/22  Yes [provider]  atorvastatin  (LIPITOR) 20 MG tablet Take 20 mg by mouth daily. Patient not taking: Reported on 06/09/2023 10/26/15   [provider]  famotidine  (PEPCID ) 20 MG tablet Take 20 mg by mouth daily.  [provider]  finasteride (PROSCAR) 5 MG tablet Take 5 mg by mouth daily. Patient not taking: Reported on 06/09/2023 05/30/23   [provider]  gabapentin  (NEURONTIN ) 600 MG tablet Take 1 tablet (600 mg total) by mouth 3 (three) times daily. 06/06/22   Sater, Charlie LABOR, MD  Ibuprofen -diphenhydrAMINE HCl 200-25 MG CAPS Take 2 tablets by mouth at bedtime as needed (sleep/pain).     [provider]  sucralfate  (CARAFATE ) 1 g tablet Take 1 tablet (1 g total) by mouth 4 (four) times daily -  with meals and at bedtime for 7 days. 06/09/23 06/16/23  Keith Sor, PA-C    Family  History Family History  Problem Relation Age of Onset   Coronary artery disease Mother    Lung cancer Mother    Crohn's disease Brother    Stroke Maternal Grandfather     Social History Social History   Tobacco Use   Smoking status: Never   Smokeless tobacco: Former   Tobacco comments:    quit 15 years ago  Vaping Use   Vaping status: Never Used  Substance Use Topics   Alcohol use: Yes    Comment: socially   Drug use: No     Allergies   Penicillins   Review of Systems Review of Systems  Constitutional:  Positive for chills and fever.  HENT:  Positive for sore throat.   Musculoskeletal:  Positive for arthralgias and myalgias.  All other systems reviewed and are negative.    Physical Exam Triage Vital Signs ED Triage Vitals [11/10/23 0902]  Encounter Vitals Group     BP      Systolic BP Percentile      Diastolic BP Percentile      Pulse      Resp      Temp      Temp src      SpO2      Weight 232 lb (105.2 kg)     Height 5' 8 (1.727 m)     Head Circumference      Peak Flow      Pain Score 5     Pain Loc      Pain Education      Exclude from Growth Chart    No data found.  Updated Vital Signs BP (!) 155/83 (BP Location: Left Arm)   Pulse 85   Temp (!) 101.3 F (38.5 C) (Oral)   Resp 17   Ht 5' 8 (1.727 m)   Wt 232 lb (105.2 kg)   SpO2 93%   BMI 35.28 kg/m      Physical Exam Vitals and nursing note reviewed.  Constitutional:      General: He is not in acute distress.    Appearance: Normal appearance. He is obese. He is ill-appearing.  HENT:     Head: Normocephalic and atraumatic.     Right Ear: Tympanic membrane and external ear normal.     Left Ear: Tympanic membrane and external ear normal.     Mouth/Throat:     Mouth: Mucous membranes are moist.     Pharynx: Oropharynx is clear.  Eyes:     Extraocular Movements: Extraocular movements intact.     Conjunctiva/sclera: Conjunctivae normal.     Pupils: Pupils are equal, round, and  reactive to light.  Cardiovascular:     Rate and Rhythm: Normal rate and regular rhythm.     Pulses: Normal pulses.     Heart sounds: Normal heart sounds.  Pulmonary:     Effort: Pulmonary effort is normal.     Breath sounds: Normal breath sounds. No wheezing, rhonchi or rales.     Comments: Frequent cough on exam Musculoskeletal:        General: Normal range of motion.     Cervical back: Normal range of motion and neck supple.  Skin:    General: Skin is warm and dry.  Neurological:     General: No focal deficit present.     Mental Status: He is alert and oriented to person, place, and time. Mental status is at baseline.  Psychiatric:        Mood and Affect: Mood normal.        Behavior: Behavior normal.      UC Treatments / Results  Labs (all labs ordered are listed, but only abnormal results are displayed) Labs Reviewed  RESP PANEL BY RT-PCR (RSV, FLU A&B, COVID)  [id number]    EKG   Radiology No results found.  Procedures Procedures (including critical care time)  Medications Ordered in UC Medications  acetaminophen  (TYLENOL ) tablet 1,000 mg (1,000 mg Oral Given 11/10/23 0930)    Initial Impression / Assessment and Plan / UC Course  I have reviewed the triage vital signs and the nursing notes.  Pertinent labs & imaging results that were available during my care of the patient were reviewed by me and considered in my medical decision making (see chart for details).     MDM: 1.  Influenza-like symptoms-Rx'd Tamiflu  75 mg capsule: Take 1 capsule twice daily x 5 days; 2.  Cough unspecified type-Rx'd Tessalon  200 mg capsules: Take 1 capsule 3 times daily, as needed, Rx'd Promethazine  DM 6.25-15 Mg/5 mL syrup: Take 5 mL twice daily, as needed for cough.  Fever, unspecified-1 g of Tylenol  given to patient in clinic.  Advised patient may take OTC Tylenol  1 g every 6 hours for fever (oral temperature greater than 100.3) Final Clinical Impressions(s) / UC Diagnoses   Final  diagnoses:  Influenza-like symptoms  Cough, unspecified type  Fever, unspecified     Discharge Instructions      Advised patient to take medications as directed with food to completion.  Advised patient to take prednisone  with first dose of Tamiflu  for the next 5 days.  Advised may take Tessalon  capsules daily or as needed for cough.  Advised may take Promethazine  DM cough syrup at night for cough prior to sleep due to sedative effects. Advised patient may take OTC Tylenol  1 g every 6 hours for fever (oral temperature greater than 100.3).  Encouraged to increase daily water intake to 64 ounces per day while taking these medications.     ED Prescriptions     Medication Sig Dispense Auth. Provider   oseltamivir  (TAMIFLU ) 75 MG capsule Take 1 capsule (75 mg total) by mouth every 12 (twelve) hours. 10 capsule Leni Pankonin, FNP   predniSONE  (DELTASONE ) 20 MG tablet Take 3 tabs PO daily x 5 days. 15 tablet Amel Kitch, FNP   benzonatate  (TESSALON ) 200 MG capsule Take 1 capsule (200 mg total) by mouth 3 (three) times daily as needed for up to 7 days. 40 capsule Anajulia Leyendecker, FNP   promethazine -dextromethorphan (PROMETHAZINE -DM) 6.25-15 MG/5ML syrup Take 5 mLs by mouth 2 (two) times daily as needed for cough. 118 mL Malayla Granberry, FNP      PDMP not reviewed this encounter.   Decoda, Van, FNP 11/10/23 [phone]

## 2023-11-10 NOTE — Discharge Instructions (Addendum)
 Advised patient to take medications as directed with food to completion.  Advised patient to take prednisone  with first dose of Tamiflu  for the next 5 days.  Advised may take Tessalon  capsules daily or as needed for cough.  Advised may take Promethazine  DM cough syrup at night for cough prior to sleep due to sedative effects. Advised patient may take OTC Tylenol  1 g every 6 hours for fever (oral temperature greater than 100.3).  Encouraged to increase daily water intake to 64 ounces per day while taking these medications.

## 2024-04-15 DIAGNOSIS — H5203 Hypermetropia, bilateral: Secondary | ICD-10-CM | POA: Diagnosis not present

## 2024-04-27 DIAGNOSIS — I10 Essential (primary) hypertension: Secondary | ICD-10-CM | POA: Diagnosis not present

## 2024-04-27 DIAGNOSIS — Z6837 Body mass index (BMI) 37.0-37.9, adult: Secondary | ICD-10-CM | POA: Diagnosis not present

## 2024-04-27 DIAGNOSIS — E785 Hyperlipidemia, unspecified: Secondary | ICD-10-CM | POA: Diagnosis not present

## 2024-04-27 DIAGNOSIS — K529 Noninfective gastroenteritis and colitis, unspecified: Secondary | ICD-10-CM | POA: Diagnosis not present

## 2024-04-27 DIAGNOSIS — R7303 Prediabetes: Secondary | ICD-10-CM | POA: Diagnosis not present

## 2024-05-25 DIAGNOSIS — M7061 Trochanteric bursitis, right hip: Secondary | ICD-10-CM | POA: Diagnosis not present

## 2024-08-12 DIAGNOSIS — B029 Zoster without complications: Secondary | ICD-10-CM | POA: Diagnosis not present

## 2024-08-12 DIAGNOSIS — Z6839 Body mass index (BMI) 39.0-39.9, adult: Secondary | ICD-10-CM | POA: Diagnosis not present

## 2024-08-12 DIAGNOSIS — I10 Essential (primary) hypertension: Secondary | ICD-10-CM | POA: Diagnosis not present

## 2024-10-19 ENCOUNTER — Encounter (HOSPITAL_COMMUNITY): Payer: Self-pay

## 2024-10-19 ENCOUNTER — Emergency Department (HOSPITAL_COMMUNITY)

## 2024-10-19 ENCOUNTER — Other Ambulatory Visit: Payer: Self-pay

## 2024-10-19 ENCOUNTER — Emergency Department (HOSPITAL_COMMUNITY)
Admission: EM | Admit: 2024-10-19 | Discharge: 2024-10-19 | Disposition: A | Discharge status: Home / Self Care | Attending: Emergency Medicine | Admitting: Emergency Medicine

## 2024-10-19 DIAGNOSIS — M545 Low back pain, unspecified: Secondary | ICD-10-CM | POA: Insufficient documentation

## 2024-10-19 DIAGNOSIS — Z7982 Long term (current) use of aspirin: Secondary | ICD-10-CM | POA: Insufficient documentation

## 2024-10-19 LAB — URINALYSIS, ROUTINE W REFLEX MICROSCOPIC
Bilirubin Urine: NEGATIVE
Glucose, UA: NEGATIVE mg/dL
Hgb urine dipstick: NEGATIVE
Ketones, ur: NEGATIVE mg/dL
Leukocytes,Ua: NEGATIVE
Nitrite: NEGATIVE
Protein, ur: NEGATIVE mg/dL
Specific Gravity, Urine: 1.003 — ABNORMAL LOW (ref 1.005–1.030)
pH: 7 (ref 5.0–8.0)

## 2024-10-19 MED ORDER — NAPROXEN 500 MG PO TABS: 500.0000 mg | ORAL_TABLET | Freq: Two times a day (BID) | ORAL | 0 refills | Status: AC

## 2024-10-19 MED ORDER — KETOROLAC TROMETHAMINE 30 MG/ML IJ SOLN
30.0000 mg | Freq: Once | INTRAMUSCULAR | Status: AC
  Administered 2024-10-19: 30 mg via INTRAMUSCULAR
  Filled 2024-10-19: qty 1

## 2024-10-19 MED ORDER — OXYCODONE-ACETAMINOPHEN 5-325 MG PO TABS
2.0000 | ORAL_TABLET | Freq: Once | ORAL | Status: AC
  Administered 2024-10-19: 2 via ORAL
  Filled 2024-10-19: qty 2

## 2024-10-19 MED ORDER — PREDNISONE 20 MG PO TABS
40.0000 mg | ORAL_TABLET | Freq: Once | ORAL | Status: AC
  Administered 2024-10-19: 40 mg via ORAL
  Filled 2024-10-19: qty 2

## 2024-10-19 MED ORDER — PREDNISONE 10 MG PO TABS: 40.0000 mg | ORAL_TABLET | Freq: Every day | ORAL | 0 refills | Status: AC

## 2024-10-19 MED ORDER — CYCLOBENZAPRINE HCL 10 MG PO TABS
5.0000 mg | ORAL_TABLET | Freq: Once | ORAL | Status: AC
  Administered 2024-10-19: 5 mg via ORAL
  Filled 2024-10-19: qty 1

## 2024-10-19 MED ORDER — OXYCODONE-ACETAMINOPHEN 5-325 MG PO TABS
1.0000 | ORAL_TABLET | Freq: Once | ORAL | Status: AC
  Administered 2024-10-19: 1 via ORAL
  Filled 2024-10-19: qty 1

## 2024-10-19 NOTE — ED Triage Notes (Signed)
 Pt came in via POV d/t lower back pain @ L4/L5/S1 (per pt), pain started on Jan 10 & he rates the pain 10/0 during triage. A/Ox4.

## 2024-10-19 NOTE — ED Notes (Signed)
 Patient transported to MRI

## 2024-10-19 NOTE — ED Provider Notes (Signed)
 " [facility] Provider Note   CSN: [id number] Arrival date & time: 10/19/24  1015     Patient presents with: Back Pain   [name] is a 69 y.o. male with a history of lumbar fusion with back surgery in 2016, who presents with back pain, ongoing for the past 10 days.  The pain is described as an intense, sharp ache, constant in nature, that radiates to bilateral lower extremities.  The patient states that the pain is worsened with movement and improved with rest.  The patient denies a specific traumatic event, however states that he just got back form a cruise and was walking a lot more/reports minor overuse or strain.  There is no associated numbness, tingling, weakness, joint instability, deformity, or loss of function. The patient has tried home pain medication for symptom relief with minimal improvement.  Patient states this pain is notably different than his chronic pain.  He states that he attempted to contact his orthopedics office to is unable to get him in until March and he is concerned that something may be wrong with his surgery site given his level of pain. The patient is in no acute distress.     Back Pain      Prior to Admission medications  Medication Sig Start Date End Date Taking? Authorizing Provider  aspirin  81 MG tablet Take 81 mg by mouth daily.    Provider, Historical, [name]  atorvastatin  (LIPITOR) 20 MG tablet Take 20 mg by mouth daily. Patient not taking: Reported on 06/09/2023 10/26/15   Provider, Historical, [name]  famotidine  (PEPCID ) 20 MG tablet Take 20 mg by mouth daily.      Provider, Historical, [name]  finasteride (PROSCAR) 5 MG tablet Take 5 mg by mouth daily. Patient not taking: Reported on 06/09/2023 05/30/23   Provider, Historical, [name]  gabapentin  (NEURONTIN ) 600 MG tablet Take 1 tablet (600 mg total) by mouth 3 (three) times daily. 06/06/22   Sater, Charlie LABOR, [name]  Ibuprofen -diphenhydrAMINE HCl 200-25 MG CAPS Take 2  tablets by mouth at bedtime as needed (sleep/pain).     Provider, Historical, [name]  losartan (COZAAR) 25 MG tablet Take 25 mg by mouth daily. 03/23/22   Provider, Historical, [name]  oseltamivir  (TAMIFLU ) 75 MG capsule Take 1 capsule (75 mg total) by mouth every 12 (twelve) hours. 11/10/23   Ragan, Breccan, FNP  oxyCODONE -acetaminophen  (PERCOCET/ROXICET) 5-325 MG tablet Take 1 tablet by mouth every 6 (six) hours as needed for severe pain. 06/09/23   Keith Sor, PA-C  pantoprazole  (PROTONIX ) 20 MG tablet Take 1 tablet (20 mg total) by mouth daily. 06/09/23   Keith Sor, PA-C  predniSONE  (DELTASONE ) 20 MG tablet Take 3 tabs PO daily x 5 days. 11/10/23   Ragan, Yacoub, FNP  promethazine -dextromethorphan (PROMETHAZINE -DM) 6.25-15 MG/5ML syrup Take 5 mLs by mouth 2 (two) times daily as needed for cough. 11/10/23   Ragan, Cleotha, FNP  REPATHA 140 MG/ML SOSY Inject 1 mL into the skin every 14 (fourteen) days. 05/20/23   Provider, Historical, [name]  simvastatin (ZOCOR) 20 MG tablet Take 20 mg by mouth every evening. 05/09/23   Provider, Historical, [name]  sucralfate  (CARAFATE ) 1 g tablet Take 1 tablet (1 g total) by mouth 4 (four) times daily -  with meals and at bedtime for 7 days. 06/09/23 06/16/23  Keith Sor, PA-C  tadalafil (CIALIS) 10 MG tablet Take 10 mg by mouth as needed.    Provider, Historical, [name]  tamsulosin  (FLOMAX ) 0.4  MG CAPS capsule Take 0.8 mg by mouth daily. 03/23/22   Provider, Historical, [name]    Allergies: Penicillins    Review of Systems  Musculoskeletal:  Positive for back pain.    Updated Vital Signs BP (!) 162/110   Pulse 77   Temp 98.1 F (36.7 C)   Resp 18   Ht 5' 8 (1.727 m)   Wt 108.9 kg   SpO2 94%   BMI 36.49 kg/m   Physical Exam Vitals and nursing note reviewed.  Constitutional:      General: He is not in acute distress.    Appearance: Normal appearance.  HENT:     Head: Normocephalic and atraumatic.  Eyes:     Extraocular Movements: Extraocular movements intact.      Conjunctiva/sclera: Conjunctivae normal.     Pupils: Pupils are equal, round, and reactive to light.  Cardiovascular:     Rate and Rhythm: Normal rate and regular rhythm.     Pulses: Normal pulses.  Pulmonary:     Effort: Pulmonary effort is normal. No respiratory distress.  Abdominal:     General: Abdomen is flat.     Palpations: Abdomen is soft.     Tenderness: There is no abdominal tenderness.  Musculoskeletal:     Cervical back: Normal range of motion.     Lumbar back: Tenderness and bony tenderness present. No swelling or deformity. Decreased range of motion.  Skin:    General: Skin is warm and dry.     Capillary Refill: Capillary refill takes less than 2 seconds.  Neurological:     General: No focal deficit present.     Mental Status: He is alert. Mental status is at baseline.  Psychiatric:        Mood and Affect: Mood normal.     (all labs ordered are listed, but only abnormal results are displayed) Labs Reviewed  URINALYSIS, ROUTINE W REFLEX MICROSCOPIC - Abnormal; Notable for the following components:      Result Value   Color, Urine STRAW (*)    Specific Gravity, Urine 1.003 (*)    All other components within normal limits    EKG: None  Radiology: CT Lumbar Spine Wo Contrast Result Date: 10/19/2024 EXAM: CT OF THE LUMBAR SPINE WITHOUT CONTRAST 10/19/2024 11:17:00 AM TECHNIQUE: CT of the lumbar spine was performed without the administration of intravenous contrast. Multiplanar reformatted images are provided for review. Automated exposure control, iterative reconstruction, and/or weight based adjustment of the mA/kV was utilized to reduce the radiation dose to as low as reasonably achievable. COMPARISON: CT of the lumbar spine dated 02/10/2020. CLINICAL HISTORY: Low back pain, increased fracture risk; Low back pain, symptoms persist with > 6 wks treatment. FINDINGS: BONES AND ALIGNMENT: Normal vertebral body heights. No acute fracture or suspicious bone lesion. Normal  alignment. Status post right laminotomy and interbody fusion and posterolateral spinal fixation at L4-L5. DEGENERATIVE CHANGES: At L2-L3, there is broad-based disc bulging and bilateral facet arthrosis causing mild-to-moderate central spinal canal stenosis and bilateral lateral recess stenosis. At L3-L4, there is broad-based disc bulging and bilateral facet hypertrophy, causing moderate central spinal canal stenosis and moderate bilateral lateral recess stenosis. At L5-S1, there is degenerative disc bulging and endplate ridging and moderate bilateral facet arthrosis, causing mild-to-moderate bilateral neuroforaminal stenosis. SOFT TISSUES: No acute abnormality. IMPRESSION: 1. Status post right laminotomy and interbody fusion and posterolateral spinal fixation at L4-5. 2. Mild-to-moderate central spinal canal stenosis and bilateral lateral recess stenosis at L2-3 due to broad-based disc bulging  and bilateral facet arthrosis. 3. Moderate central spinal canal stenosis and moderate bilateral lateral recess stenosis at L3-4 due to broad-based disc bulging and bilateral facet hypertrophy. 4. Mild-to-moderate bilateral neuroforaminal stenosis at L5-S1 due to degenerative disc bulging, endplate ridging, and moderate bilateral facet arthrosis. Electronically signed by: [name] 10/19/2024 11:42 AM EST RP Workstation: [id number]     Procedures   Medications Ordered in the ED  predniSONE  (DELTASONE ) tablet 40 mg (40 mg Oral Given 10/19/24 1105)  oxyCODONE -acetaminophen  (PERCOCET/ROXICET) 5-325 MG per tablet 2 tablet (2 tablets Oral Given 10/19/24 1105)  cyclobenzaprine  (FLEXERIL ) tablet 5 mg (5 mg Oral Given 10/19/24 1104)  ketorolac  (TORADOL ) 30 MG/ML injection 30 mg (30 mg Intramuscular Given 10/19/24 1247)  oxyCODONE -acetaminophen  (PERCOCET/ROXICET) 5-325 MG per tablet 1 tablet (1 tablet Oral Given 10/19/24 1247)                                 Medical Decision Making  Patient presents to the ED for:  back pain This involves an extensive number of treatment options Differential diagnosis includes: Minor MSK etiology Traumatic etiology Co-morbid conditions: Lumbar fusion in 2016  Additional history/records obtained and reviewed: Additional history obtained from  outside medical records External records from outside source obtained and reviewed including previous lumbar imaging   Clinical Course as of 10/19/24 1542  Mon Oct 19, 2024  1455 Temp: 98.1 F (36.7 C) Afebrile, vital stable, patient in no acute distress [ML]  1455 CT Lumbar Spine Wo Contrast Mildly more disk swelling/herniation when compared to previous  [ML]  1513 Pending MRI. Chronic back pain. Weakness and pain.  [DZ]    Clinical Course User Index [DZ] Terry Sieving, [name] [ML] [name], [name]    Data Reviewed / Actions Taken: Labs ordered/reviewed with my independent interpretation in ED course above. Imaging ordered/reviewed with my independent interpretation in ED course above. I agree with the radiologists interpretation.   Management / Treatments: See ED course above for medications, treatments administered, and clinical rationale.   Reevaluation of the patient after these medicines showed that the patient had moderate improvement with pain management. I have reviewed the patients home medicines and have made adjustments as needed  ED Course / Reassessments: Problem List:  69 year old male presented for back pain. Imaging was indicated based on clinical presentation and showed some moderate increase in disc bulging and hypertrophy. Given patients level of pain and pain with ambulation, there is slight clinical suspicion of further injury to surgical site or neurovascular injury. Patient MRI pending at handoff. Patient received multiple pain management doses with only slight relief. If patients MRI returns without acute finding, patient should be cleared for discharge for close follow-up with orthopedics for  further evaluation and care.   Disposition: Disposition: Discharge pending - handoff to provider  This note was produced using Dragon Medical voice recognition. While I have reviewed and verified all clinical information, transcription errors may remain.      Final diagnoses:  None    ED Discharge Orders     None          [name], [name] 10/19/24 1542  "

## 2024-10-19 NOTE — Discharge Instructions (Addendum)
 Thank you for letting us  take care of you today.  You came today for evaluation of low back pain.  We did a MRI of your back that did show some increased narrowing around some of the nerves.  We think this was causing her symptoms.  There is no evidence of acute fracture.  We will send you home with some steroids as well as anti-inflammatory medication that should help with your pain.  Please follow-up with your spine surgeon for further evaluation.

## 2024-10-19 NOTE — ED Provider Triage Note (Signed)
 Emergency Medicine Provider Triage Evaluation Note  [name] , a 69 y.o. male  was evaluated in triage.  Pt complains of low back pain with radiation to right hip.  He reports history of degenerative disease.  He has had prior surgery by Dr. Colon.  Symptoms have worsened since January 10.  He has not been able to be evaluated although he is called the neurosurgery office.  He denies numbness tingling weakness.  He does describe severe shooting pain with attempting to ambulate.  He is not on a blood thinners and has not had any trauma.  He has been taking acetaminophen  for pain at home.  Review of Systems  Positive: Pain in low back, radiation to right hip, Negative: Fever, chills, trauma, numbness, tingling, loss of bowel or bladder control  Physical Exam  BP (!) 162/110   Pulse 77   Temp 98.1 F (36.7 C)   Resp 18   SpO2 94%  Gen:   Awake, no distress   Resp:  Normal effort  MSK:   Moves extremities without difficulty some pain with movement of right lower extremity, old scar midline low back, no obvious signs of trauma, some diffuse tenderness to lumbar area and to right SI joint Other:  Strength appears intact but exam limited with patient dressed and in chair  Medical Decision Making  Medically screening exam initiated at 10:39 AM.  Appropriate orders placed.  Ozell JONETTA Na was informed that the remainder of the evaluation will be completed by another provider, this initial triage assessment does not replace that evaluation, and the importance of remaining in the ED until their evaluation is complete.  Plan pain medication, imaging urinalysis   Levander Houston, MD 10/19/24 1042
